# Patient Record
Sex: Female | Born: 1995 | Race: White | Hispanic: No | Marital: Single | Smoking: Former smoker
Health system: Southern US, Community
[De-identification: ages and names within clinical notes are randomized; demographics above are authoritative.]

## PROBLEM LIST (undated history)

## (undated) ENCOUNTER — Inpatient Hospital Stay (HOSPITAL_COMMUNITY)

## (undated) ENCOUNTER — Inpatient Hospital Stay (HOSPITAL_COMMUNITY): Payer: Self-pay

## (undated) DIAGNOSIS — F329 Major depressive disorder, single episode, unspecified: Secondary | ICD-10-CM

## (undated) DIAGNOSIS — R519 Headache, unspecified: Secondary | ICD-10-CM

## (undated) DIAGNOSIS — R51 Headache: Secondary | ICD-10-CM

## (undated) DIAGNOSIS — A749 Chlamydial infection, unspecified: Secondary | ICD-10-CM

## (undated) DIAGNOSIS — F319 Bipolar disorder, unspecified: Secondary | ICD-10-CM

## (undated) DIAGNOSIS — F32A Depression, unspecified: Secondary | ICD-10-CM

## (undated) DIAGNOSIS — F419 Anxiety disorder, unspecified: Secondary | ICD-10-CM

## (undated) HISTORY — PX: NO PAST SURGERIES: SHX2092

---
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
## 2016-04-03 ENCOUNTER — Encounter (HOSPITAL_BASED_OUTPATIENT_CLINIC_OR_DEPARTMENT_OTHER): Payer: Self-pay | Admitting: *Deleted

## 2016-04-03 ENCOUNTER — Emergency Department (HOSPITAL_BASED_OUTPATIENT_CLINIC_OR_DEPARTMENT_OTHER)
Admission: EM | Admit: 2016-04-03 | Discharge: 2016-04-03 | Disposition: A | Discharge status: Home / Self Care | Attending: Emergency Medicine | Admitting: Emergency Medicine

## 2016-04-03 ENCOUNTER — Emergency Department (HOSPITAL_BASED_OUTPATIENT_CLINIC_OR_DEPARTMENT_OTHER)

## 2016-04-03 DIAGNOSIS — R102 Pelvic and perineal pain: Secondary | ICD-10-CM | POA: Insufficient documentation

## 2016-04-03 DIAGNOSIS — N390 Urinary tract infection, site not specified: Secondary | ICD-10-CM

## 2016-04-03 DIAGNOSIS — O23591 Infection of other part of genital tract in pregnancy, first trimester: Secondary | ICD-10-CM | POA: Diagnosis not present

## 2016-04-03 DIAGNOSIS — Z3490 Encounter for supervision of normal pregnancy, unspecified, unspecified trimester: Secondary | ICD-10-CM

## 2016-04-03 DIAGNOSIS — N72 Inflammatory disease of cervix uteri: Secondary | ICD-10-CM

## 2016-04-03 DIAGNOSIS — O23511 Infections of cervix in pregnancy, first trimester: Secondary | ICD-10-CM | POA: Insufficient documentation

## 2016-04-03 DIAGNOSIS — F17210 Nicotine dependence, cigarettes, uncomplicated: Secondary | ICD-10-CM | POA: Diagnosis not present

## 2016-04-03 DIAGNOSIS — E876 Hypokalemia: Secondary | ICD-10-CM | POA: Insufficient documentation

## 2016-04-03 DIAGNOSIS — O2341 Unspecified infection of urinary tract in pregnancy, first trimester: Secondary | ICD-10-CM | POA: Insufficient documentation

## 2016-04-03 DIAGNOSIS — B9689 Other specified bacterial agents as the cause of diseases classified elsewhere: Secondary | ICD-10-CM

## 2016-04-03 DIAGNOSIS — O99331 Smoking (tobacco) complicating pregnancy, first trimester: Secondary | ICD-10-CM | POA: Insufficient documentation

## 2016-04-03 DIAGNOSIS — N760 Acute vaginitis: Secondary | ICD-10-CM

## 2016-04-03 DIAGNOSIS — Z202 Contact with and (suspected) exposure to infections with a predominantly sexual mode of transmission: Secondary | ICD-10-CM | POA: Diagnosis not present

## 2016-04-03 DIAGNOSIS — O219 Vomiting of pregnancy, unspecified: Secondary | ICD-10-CM | POA: Insufficient documentation

## 2016-04-03 DIAGNOSIS — Z3A01 Less than 8 weeks gestation of pregnancy: Secondary | ICD-10-CM | POA: Insufficient documentation

## 2016-04-03 HISTORY — DX: Chlamydial infection, unspecified: A74.9

## 2016-04-03 LAB — COMPREHENSIVE METABOLIC PANEL
ALT: 11 U/L — ABNORMAL LOW (ref 14–54)
ANION GAP: 9 (ref 5–15)
AST: 17 U/L (ref 15–41)
Albumin: 4.3 g/dL (ref 3.5–5.0)
Alkaline Phosphatase: 41 U/L (ref 38–126)
BUN: 7 mg/dL (ref 6–20)
CHLORIDE: 105 mmol/L (ref 101–111)
CO2: 21 mmol/L — ABNORMAL LOW (ref 22–32)
Calcium: 9.2 mg/dL (ref 8.9–10.3)
Creatinine, Ser: 0.59 mg/dL (ref 0.44–1.00)
GFR calc Af Amer: >60 mL/min (ref >60)
Glucose, Bld: 91 mg/dL (ref 65–99)
POTASSIUM: 3.2 mmol/L — AB (ref 3.5–5.1)
SODIUM: 135 mmol/L (ref 135–145)
Total Bilirubin: 0.9 mg/dL (ref 0.3–1.2)
Total Protein: 7.1 g/dL (ref 6.5–8.1)

## 2016-04-03 LAB — CBC WITH DIFFERENTIAL/PLATELET
BASOS PCT: 0 %
Basophils Absolute: 0 10*3/uL (ref 0.0–0.1)
EOS ABS: 0 10*3/uL (ref 0.0–0.7)
EOS PCT: 0 %
HCT: 38.6 % (ref 36.0–46.0)
Hemoglobin: 14 g/dL (ref 12.0–15.0)
LYMPHS ABS: 2.1 10*3/uL (ref 0.7–4.0)
Lymphocytes Relative: 18 %
MCH: 32.5 pg (ref 26.0–34.0)
MCHC: 36.3 g/dL — ABNORMAL HIGH (ref 30.0–36.0)
MCV: 89.6 fL (ref 78.0–100.0)
Monocytes Absolute: 0.9 10*3/uL (ref 0.1–1.0)
Monocytes Relative: 8 %
Neutro Abs: 8.6 10*3/uL — ABNORMAL HIGH (ref 1.7–7.7)
Neutrophils Relative %: 74 %
PLATELETS: 252 10*3/uL (ref 150–400)
RBC: 4.31 MIL/uL (ref 3.87–5.11)
RDW: 11.8 % (ref 11.5–15.5)
WBC: 11.6 10*3/uL — AB (ref 4.0–10.5)

## 2016-04-03 LAB — URINE MICROSCOPIC-ADD ON: RBC / HPF: NONE SEEN RBC/hpf (ref 0–5)

## 2016-04-03 LAB — URINALYSIS, ROUTINE W REFLEX MICROSCOPIC
Bilirubin Urine: NEGATIVE
GLUCOSE, UA: NEGATIVE mg/dL
Hgb urine dipstick: NEGATIVE
NITRITE: NEGATIVE
PROTEIN: 30 mg/dL — AB
Specific Gravity, Urine: 1.023 (ref 1.005–1.030)
pH: 6 (ref 5.0–8.0)

## 2016-04-03 LAB — MAGNESIUM: Magnesium: 2 mg/dL (ref 1.7–2.4)

## 2016-04-03 LAB — WET PREP, GENITAL
Sperm: NONE SEEN
TRICH WET PREP: NONE SEEN
YEAST WET PREP: NONE SEEN

## 2016-04-03 LAB — LIPASE, BLOOD: LIPASE: 25 U/L (ref 11–51)

## 2016-04-03 LAB — PREGNANCY, URINE: PREG TEST UR: POSITIVE — AB

## 2016-04-03 LAB — HCG, QUANTITATIVE, PREGNANCY: hCG, Beta Chain, Quant, S: 28774 m[IU]/mL — ABNORMAL HIGH (ref <5)

## 2016-04-03 MED ORDER — AZITHROMYCIN 500 MG IV SOLR
INTRAVENOUS | Status: AC
  Filled 2016-04-03: qty 500

## 2016-04-03 MED ORDER — DEXTROSE 5 % IV SOLN
500.0000 mg | Freq: Once | INTRAVENOUS | Status: AC
  Administered 2016-04-03: 500 mg via INTRAVENOUS

## 2016-04-03 MED ORDER — SODIUM CHLORIDE 0.9 % IV BOLUS (SEPSIS)
1000.0000 mL | Freq: Once | INTRAVENOUS | Status: AC
  Administered 2016-04-03: 1000 mL via INTRAVENOUS

## 2016-04-03 MED ORDER — PROMETHAZINE HCL 25 MG/ML IJ SOLN
25.0000 mg | Freq: Once | INTRAMUSCULAR | Status: AC
  Administered 2016-04-03: 25 mg via INTRAVENOUS
  Filled 2016-04-03: qty 1

## 2016-04-03 MED ORDER — CEPHALEXIN 500 MG PO CAPS: 500.0000 mg | ORAL_CAPSULE | Freq: Two times a day (BID) | ORAL | 0 refills | Status: DC

## 2016-04-03 MED ORDER — PRENATAL COMPLETE 14-0.4 MG PO TABS: 1.0000 | ORAL_TABLET | Freq: Every day | ORAL | 1 refills | Status: DC

## 2016-04-03 MED ORDER — METRONIDAZOLE 500 MG PO TABS
500.0000 mg | ORAL_TABLET | Freq: Once | ORAL | Status: DC
  Filled 2016-04-03: qty 1

## 2016-04-03 MED ORDER — DOXYLAMINE-PYRIDOXINE 10-10 MG PO TBEC: DELAYED_RELEASE_TABLET | ORAL | 0 refills | Status: DC

## 2016-04-03 MED ORDER — CEFTRIAXONE SODIUM 250 MG IJ SOLR
250.0000 mg | Freq: Once | INTRAMUSCULAR | Status: AC
  Administered 2016-04-03: 250 mg via INTRAMUSCULAR
  Filled 2016-04-03: qty 250

## 2016-04-03 MED ORDER — LIDOCAINE HCL (PF) 1 % IJ SOLN
INTRAMUSCULAR | Status: AC
  Administered 2016-04-03: 0.9 mL
  Filled 2016-04-03: qty 5

## 2016-04-03 MED ORDER — CEPHALEXIN 250 MG PO CAPS
500.0000 mg | ORAL_CAPSULE | Freq: Once | ORAL | Status: DC
  Filled 2016-04-03: qty 2

## 2016-04-03 MED ORDER — AZITHROMYCIN 500 MG IV SOLR
500.0000 mg | Freq: Once | INTRAVENOUS | Status: AC
  Administered 2016-04-03: 500 mg via INTRAVENOUS

## 2016-04-03 MED ORDER — POTASSIUM CHLORIDE CRYS ER 20 MEQ PO TBCR: 20.0000 meq | EXTENDED_RELEASE_TABLET | Freq: Every day | ORAL | 0 refills | Status: DC

## 2016-04-03 MED ORDER — ONDANSETRON HCL 4 MG/2ML IJ SOLN
4.0000 mg | Freq: Once | INTRAMUSCULAR | Status: AC
  Administered 2016-04-03: 4 mg via INTRAVENOUS
  Filled 2016-04-03 (×2): qty 2

## 2016-04-03 MED ORDER — AZITHROMYCIN 250 MG PO TABS
1000.0000 mg | ORAL_TABLET | Freq: Once | ORAL | Status: AC
  Administered 2016-04-03: 1000 mg via ORAL
  Filled 2016-04-03: qty 4

## 2016-04-03 MED ORDER — POTASSIUM CHLORIDE CRYS ER 20 MEQ PO TBCR: 40.0000 meq | EXTENDED_RELEASE_TABLET | Freq: Once | ORAL | Status: DC

## 2016-04-03 MED ORDER — METRONIDAZOLE 500 MG PO TABS: 500.0000 mg | ORAL_TABLET | Freq: Two times a day (BID) | ORAL | 0 refills | Status: DC

## 2016-04-03 NOTE — ED Triage Notes (Signed)
Pt reports mid- LUQ abd pain x4-7days (reports it was intermittent). Reports n/v. Denies fever, genitourinary symptoms.

## 2016-04-03 NOTE — Discharge Instructions (Signed)
Follow with OB/GYN as soon as possible.   Do NOT take any NSAIDs, such as Aspirin, Motrin, Ibuprofen, Aleve, Naproxen etc. Only take Tylenol for pain. Return to the emergency room  for any severe abdominal pain, vaginal bleeding, passing out or repeated vomiting.   Obtain over-the-counter prenatal vitamins. Read the label and make sure that they have at least 400 mcg of folate acid.

## 2016-04-03 NOTE — ED Provider Notes (Signed)
[facility] Provider Note   CSN: [id number] Arrival date & time: 04/03/16  1054  First Provider Contact:  First MD Initiated Contact with Patient 04/03/16 1109        History   Chief Complaint Chief Complaint  Patient presents with  . Abdominal Pain    HPI  Blood pressure 116/70, pulse 60, temperature 98.6 F (37 C), temperature source Oral, resp. rate 18, height 5\' 4"  (1.626 m), weight 61.2 kg, last menstrual period 02/28/2016, SpO2 100 %.  Joanna Gray is a [age] y.o. female complaining of Multiple episodes of nonbloody, nonbilious, non-coffee ground emesis starting 7 days ago, states she can't keep any food or fluids down states that she has vomited 5-10 times over the course of the last day. She denies fever, chills, diarrhea, sick contacts, dysuria, hematuria, flank pain, abnormal vaginal discharge. Patient also states that she would like to be treated for chlamydia, her boyfriend was recently diagnosed, she has not had any evaluation for this. She does not have OB/GYN care. States her pain is in the left upper quadrant and is 6 out of 10, states it's worse before emesis. Last period was one month ago.  HPI  Past Medical History:  Diagnosis Date  . Anxiety   . Bipolar disorder (HCC)   . Chlamydia   . Depression     Patient Active Problem List   Diagnosis Date Noted  . Substance induced mood disorder (HCC) 12/06/2015  . Polysubstance abuse 12/06/2015    History reviewed. No pertinent surgical history.  OB History    No data available       Home Medications    Prior to Admission medications   Medication Sig Start Date End Date Taking? Authorizing Provider  cephALEXin (KEFLEX) 500 MG capsule Take 1 capsule (500 mg total) by mouth 2 (two) times daily. 04/03/16   Devaris Quirk, PA-C  Doxylamine-Pyridoxine 10-10 MG TBEC Start: 2 tabs PO qhs; if sx persist after 2 days, incr. to 1 tab PO qam and 2 tabs PO qhs; may further incr. to 1 tab PO qam, 1 tab  PO every mid-afternoon, and 2 tabs PO qhs; Max: 4 tabs/day; Info: give on empty stomach; do not cut/crush/chew 04/03/16   Joni ReiningNicole Glada Wickstrom, PA-C  metroNIDAZOLE (FLAGYL) 500 MG tablet Take 1 tablet (500 mg total) by mouth 2 (two) times daily. One tab PO bid x 10 days 04/03/16   Joni ReiningNicole Maija Biggers, PA-C  potassium chloride SA (K-DUR,KLOR-CON) 20 MEQ tablet Take 1 tablet (20 mEq total) by mouth daily. 04/03/16   Jaquelin Meaney, PA-C  Prenatal Vit-Fe Fumarate-FA (PRENATAL COMPLETE) 14-0.4 MG TABS Take 1 tablet by mouth daily. 04/03/16   Joni ReiningNicole Maraki Macquarrie, PA-C    Family History No family history on file.  Social History Social History  Substance Use Topics  . Smoking status: Current Every Day Smoker    Types: Cigarettes  . Smokeless tobacco: Never Used  . Alcohol use No     Allergies   Review of patient's allergies indicates no known allergies.   Review of Systems Review of Systems  10 systems reviewed and found to be negative, except as noted in the HPI.   Physical Exam Updated Vital Signs BP 116/74 (BP Location: Left Arm)   Pulse 61   Temp 98.6 F (37 C) (Oral)   Resp 16   Ht 5\' 4"  (1.626 m)   Wt 61.2 kg   LMP 02/28/2016 (Approximate)   SpO2 100%   BMI 23.17 kg/m   Physical  Exam  Constitutional: She is oriented to person, place, and time. She appears well-developed and well-nourished. No distress.  HENT:  Head: Normocephalic and atraumatic.  Mucous membranes mildly dry  Eyes: Conjunctivae and EOM are normal. Pupils are equal, round, and reactive to light.  Neck: Normal range of motion.  Cardiovascular: Normal rate, regular rhythm and intact distal pulses.   Pulmonary/Chest: Effort normal and breath sounds normal.  Abdominal: Soft. She exhibits no distension and no mass. There is no tenderness. There is no rebound and no guarding. No hernia.  Genitourinary:  Genitourinary Comments: Pelvic exam a chaperoned by technician: No rashes or lesions, scant, whitish yellow,  homogenous opaque non-foul-smelling vaginal discharge. No cervical motion or adnexal tenderness.  Musculoskeletal: Normal range of motion.  Neurological: She is alert and oriented to person, place, and time.  Skin: She is not diaphoretic.  Psychiatric: She has a normal mood and affect.  Nursing note and vitals reviewed.    ED Treatments / Results  Labs (all labs ordered are listed, but only abnormal results are displayed) Labs Reviewed  WET PREP, GENITAL - Abnormal; Notable for the following:       Result Value   Clue Cells Wet Prep HPF POC PRESENT (*)    WBC, Wet Prep HPF POC MANY (*)    All other components within normal limits  CBC WITH DIFFERENTIAL/PLATELET - Abnormal; Notable for the following:    WBC 11.6 (*)    MCHC 36.3 (*)    Neutro Abs 8.6 (*)    All other components within normal limits  COMPREHENSIVE METABOLIC PANEL - Abnormal; Notable for the following:    Potassium 3.2 (*)    CO2 21 (*)    ALT 11 (*)    All other components within normal limits  URINALYSIS, ROUTINE W REFLEX MICROSCOPIC (NOT AT [facility]) - Abnormal; Notable for the following:    Ketones, ur >80 (*)    Protein, ur 30 (*)    Leukocytes, UA MODERATE (*)    All other components within normal limits  PREGNANCY, URINE - Abnormal; Notable for the following:    Preg Test, Ur POSITIVE (*)    All other components within normal limits  HCG, QUANTITATIVE, PREGNANCY - Abnormal; Notable for the following:    hCG, Beta Chain, Quant, S 28,774 (*)    All other components within normal limits  URINE MICROSCOPIC-ADD ON - Abnormal; Notable for the following:    Squamous Epithelial / LPF 0-5 (*)    Bacteria, UA MANY (*)    All other components within normal limits  URINE CULTURE  LIPASE, BLOOD  MAGNESIUM  RPR  HIV ANTIBODY (ROUTINE TESTING)  GC/CHLAMYDIA PROBE AMP ([place]) NOT AT [facility]    EKG  EKG Interpretation None       Radiology US Ob Comp Less 14 Wks  Result Date: 04/03/2016 CLINICAL DATA:   LEFT pelvic pain for 1 day, nausea, vomiting, pregnant EXAM: OBSTETRIC <14 WK [place] AND TRANSVAGINAL OB US TECHNIQUE: Both transabdominal and transvaginal ultrasound examinations were performed for complete evaluation of the gestation as well as the maternal uterus, adnexal regions, and pelvic cul-de-sac. Transvaginal technique was performed to assess early pregnancy. COMPARISON:  None FINDINGS: Intrauterine gestational sac: Present Yolk sac:  Present Embryo:  Not identified Cardiac Activity: N/A Heart Rate: N/A  bpm MSD: 14.2  mm   6 w   2  d Subchorionic hemorrhage:  None visualized. Maternal uterus/adnexae: LEFT ovary normal size and morphology, 1.7 x 2.3 x  2.0 cm. RIGHT ovary measures 3.2 x 3.4 x 2.5 cm and contains a potential hemorrhagic corpus luteal cyst measuring 2.8 x 1.7 x 1.2 cm. Blood flow present within both ovaries on color Doppler imaging No free pelvic fluid or adnexal masses. IMPRESSION: Intrauterine gestational sac containing a yolk sac but no fetal pole identified. Probable hemorrhagic corpus luteal cyst RIGHT ovary. Electronically Signed   By: Ulyses Southward M.D.   On: 04/03/2016 14:01   US Ob Transvaginal  Result Date: 04/03/2016 CLINICAL DATA:  LEFT pelvic pain for 1 day, nausea, vomiting, pregnant EXAM: OBSTETRIC <14 WK [place] AND TRANSVAGINAL OB US TECHNIQUE: Both transabdominal and transvaginal ultrasound examinations were performed for complete evaluation of the gestation as well as the maternal uterus, adnexal regions, and pelvic cul-de-sac. Transvaginal technique was performed to assess early pregnancy. COMPARISON:  None FINDINGS: Intrauterine gestational sac: Present Yolk sac:  Present Embryo:  Not identified Cardiac Activity: N/A Heart Rate: N/A  bpm MSD: 14.2  mm   6 w   2  d Subchorionic hemorrhage:  None visualized. Maternal uterus/adnexae: LEFT ovary normal size and morphology, 1.7 x 2.3 x 2.0 cm. RIGHT ovary measures 3.2 x 3.4 x 2.5 cm and contains a potential hemorrhagic corpus luteal  cyst measuring 2.8 x 1.7 x 1.2 cm. Blood flow present within both ovaries on color Doppler imaging No free pelvic fluid or adnexal masses. IMPRESSION: Intrauterine gestational sac containing a yolk sac but no fetal pole identified. Probable hemorrhagic corpus luteal cyst RIGHT ovary. Electronically Signed   By: Ulyses Southward M.D.   On: 04/03/2016 14:01    Procedures Procedures (including critical care time)  Medications Ordered in ED Medications  azithromycin (ZITHROMAX) 500 mg in dextrose 5 % 250 mL IVPB (500 mg Intravenous New Bag/Given 04/03/16 1544)  ondansetron (ZOFRAN) injection 4 mg (4 mg Intravenous Refused 04/03/16 1544)  azithromycin (ZITHROMAX) 500 MG injection (  Not Given 04/03/16 1545)  cephALEXin (KEFLEX) capsule 500 mg (not administered)  metroNIDAZOLE (FLAGYL) tablet 500 mg (not administered)  potassium chloride SA (K-DUR,KLOR-CON) CR tablet 40 mEq (not administered)  sodium chloride 0.9 % bolus 1,000 mL (0 mLs Intravenous Stopped 04/03/16 1310)  cefTRIAXone (ROCEPHIN) injection 250 mg (250 mg Intramuscular Given 04/03/16 1229)  azithromycin (ZITHROMAX) tablet 1,000 mg (1,000 mg Oral Given 04/03/16 1313)  lidocaine (PF) (XYLOCAINE) 1 % injection (0.9 mLs  Given 04/03/16 1230)  promethazine (PHENERGAN) injection 25 mg (25 mg Intravenous Given 04/03/16 1241)  azithromycin (ZITHROMAX) 500 mg in dextrose 5 % 250 mL IVPB (0 mg Intravenous Stopped 04/03/16 1544)  sodium chloride 0.9 % bolus 1,000 mL (1,000 mLs Intravenous New Bag/Given 04/03/16 1435)     Initial Impression / Assessment and Plan / ED Course  I have reviewed the triage vital signs and the nursing notes.  Pertinent labs & imaging results that were available during my care of the patient were reviewed by me and considered in my medical decision making (see chart for details).  Clinical Course    Vitals:   04/03/16 1108 04/03/16 1109 04/03/16 1242 04/03/16 1413  BP: 116/70  118/70 116/74  Pulse: 60  60 61  Resp: 18  18  16   Temp: 98.6 F (37 C)     TempSrc: Oral     SpO2: 100%  100% 100%  Weight:  61.2 kg    Height:  5\' 4"  (1.626 m)      Medications  azithromycin (ZITHROMAX) 500 mg in dextrose 5 % 250 mL IVPB (500 mg  Intravenous New Bag/Given 04/03/16 1544)  ondansetron (ZOFRAN) injection 4 mg (4 mg Intravenous Refused 04/03/16 1544)  azithromycin (ZITHROMAX) 500 MG injection (  Not Given 04/03/16 1545)  cephALEXin (KEFLEX) capsule 500 mg (not administered)  metroNIDAZOLE (FLAGYL) tablet 500 mg (not administered)  potassium chloride SA (K-DUR,KLOR-CON) CR tablet 40 mEq (not administered)  sodium chloride 0.9 % bolus 1,000 mL (0 mLs Intravenous Stopped 04/03/16 1310)  cefTRIAXone (ROCEPHIN) injection 250 mg (250 mg Intramuscular Given 04/03/16 1229)  azithromycin (ZITHROMAX) tablet 1,000 mg (1,000 mg Oral Given 04/03/16 1313)  lidocaine (PF) (XYLOCAINE) 1 % injection (0.9 mLs  Given 04/03/16 1230)  promethazine (PHENERGAN) injection 25 mg (25 mg Intravenous Given 04/03/16 1241)  azithromycin (ZITHROMAX) 500 mg in dextrose 5 % 250 mL IVPB (0 mg Intravenous Stopped 04/03/16 1544)  sodium chloride 0.9 % bolus 1,000 mL (1,000 mLs Intravenous New Bag/Given 04/03/16 1435)    Joanna Gray is 20 y.o. female presenting with 1 week of nausea and vomiting and left upper quadrant abdominal pain however, on my exam she has no tenderness on the abdomen at all. Patient is afebrile appears slightly dehydrated. Patient is also reporting that her boyfriend has chlamydia, she's denying any symptoms.  Patient's urine pregnancy is positive, her last menstrual period was on July 21 and she states it was not particularly light. Her vomiting is probably morning sickness, I doubt this is an ectopic based on the benign nature of the abdominal exam, however, given her subjective pain will obtain ultrasound.  Ultrasound with the intrauterine gestational sac and yolk but no fetal pole estimated date of 6 weeks and 2 days. They also see a  likely hemorrhagic corpus luteal cyst in the right ovary, this is not on the area of her pain. I don't think this is consistent with a ectopic.  Patient has continued to vomit despite Phenergan while in ultrasound, it appears that she vomited all of her azithromycin, discussed case with pharmacist at [facility] who states that the IV doses of azithromycin would be equivalent to by mouth dosage however she will need 2x runs of 500.  Urinalysis is consistent with infection, urine culture pending, she's received Rocephin for STDs, will also start her on Keflex patient is significantly dehydrated with greater than 80 ketones in her urine.  Pelvic exam with some discharge but no cervical motion or adnexal tenderness. Clue cells and white blood cells are there, I think this is just from a cervicitis. No cervical motion or adnexal tenderness consistent with PID. Will start her on Flagyl as well for bacterial vaginosis.   After fluids patient states that she feels much better, she was able to drink a smoothie and not vomit. Chlamydia. We've had an extensive discussion of return precautions and including return precautions for ectopic. [facility]  Would like to get this patient Zofran and then give her her Flagyl, Keflex and potassium however she is afraid to take the medication because she feels she will vomit. States that she just wants to go home, I've counseled her that she will need to take the medications when she gets home and if she vomits the medication she will need to return to the ED, her and her boyfriend verbalized understanding.  Evaluation does not show pathology that would require ongoing emergent intervention or inpatient treatment. Pt is hemodynamically stable and mentating appropriately. Discussed findings and plan with patient/guardian, who agrees with care plan. All questions answered. Return precautions discussed and outpatient  follow up given.       Final Clinical Impressions(s) / ED Diagnoses   Final diagnoses:  Pregnancy  Nausea/vomiting in pregnancy  Cervicitis  UTI (lower urinary tract infection)  BV (bacterial vaginosis)  Hypokalemia  Exposure to STD    New Prescriptions New Prescriptions   CEPHALEXIN (KEFLEX) 500 MG CAPSULE    Take 1 capsule (500 mg total) by mouth 2 (two) times daily.   DOXYLAMINE-PYRIDOXINE 10-10 MG TBEC    Start: 2 tabs PO qhs; if sx persist after 2 days, incr. to 1 tab PO qam and 2 tabs PO qhs; may further incr. to 1 tab PO qam, 1 tab PO every mid-afternoon, and 2 tabs PO qhs; Max: 4 tabs/day; Info: give on empty stomach; do not cut/crush/chew   METRONIDAZOLE (FLAGYL) 500 MG TABLET    Take 1 tablet (500 mg total) by mouth 2 (two) times daily. One tab PO bid x 10 days   POTASSIUM CHLORIDE SA (K-DUR,KLOR-CON) 20 MEQ TABLET    Take 1 tablet (20 mEq total) by mouth daily.   PRENATAL VIT-FE FUMARATE-FA (PRENATAL COMPLETE) 14-0.4 MG TABS    Take 1 tablet by mouth daily.     Wynetta Emery, PA-C 04/03/16 1643    Pricilla Loveless, MD 04/04/16 [phone]

## 2016-04-04 LAB — HIV ANTIBODY (ROUTINE TESTING W REFLEX): HIV SCREEN 4TH GENERATION: NONREACTIVE

## 2016-04-04 LAB — RPR: RPR: NONREACTIVE

## 2016-04-04 NOTE — ED Notes (Signed)
Pt able to tolerate lemon ice pop at time of discharge with no n/v.

## 2016-04-05 LAB — URINE CULTURE: CULTURE: NO GROWTH

## 2016-04-05 LAB — GC/CHLAMYDIA PROBE AMP (~~LOC~~) NOT AT ARMC
CHLAMYDIA, DNA PROBE: POSITIVE — AB
Neisseria Gonorrhea: NEGATIVE

## 2016-04-15 ENCOUNTER — Emergency Department (HOSPITAL_BASED_OUTPATIENT_CLINIC_OR_DEPARTMENT_OTHER)

## 2016-04-15 ENCOUNTER — Encounter (HOSPITAL_BASED_OUTPATIENT_CLINIC_OR_DEPARTMENT_OTHER): Payer: Self-pay | Admitting: *Deleted

## 2016-04-15 ENCOUNTER — Emergency Department (HOSPITAL_BASED_OUTPATIENT_CLINIC_OR_DEPARTMENT_OTHER)
Admission: EM | Admit: 2016-04-15 | Discharge: 2016-04-15 | Disposition: A | Discharge status: Home / Self Care | Attending: Emergency Medicine | Admitting: Emergency Medicine

## 2016-04-15 DIAGNOSIS — O219 Vomiting of pregnancy, unspecified: Secondary | ICD-10-CM | POA: Diagnosis not present

## 2016-04-15 DIAGNOSIS — F17210 Nicotine dependence, cigarettes, uncomplicated: Secondary | ICD-10-CM | POA: Insufficient documentation

## 2016-04-15 DIAGNOSIS — R102 Pelvic and perineal pain: Secondary | ICD-10-CM | POA: Diagnosis not present

## 2016-04-15 DIAGNOSIS — Z3A01 Less than 8 weeks gestation of pregnancy: Secondary | ICD-10-CM | POA: Insufficient documentation

## 2016-04-15 LAB — URINALYSIS, ROUTINE W REFLEX MICROSCOPIC
Bilirubin Urine: NEGATIVE
GLUCOSE, UA: NEGATIVE mg/dL
Hgb urine dipstick: NEGATIVE
Ketones, ur: >80 mg/dL — AB
Nitrite: NEGATIVE
PH: 7.5 (ref 5.0–8.0)
Protein, ur: 30 mg/dL — AB
SPECIFIC GRAVITY, URINE: 1.027 (ref 1.005–1.030)

## 2016-04-15 LAB — CBC WITH DIFFERENTIAL/PLATELET
Basophils Absolute: 0 10*3/uL (ref 0.0–0.1)
Basophils Relative: 0 %
EOS ABS: 0 10*3/uL (ref 0.0–0.7)
Eosinophils Relative: 0 %
HCT: 38.4 % (ref 36.0–46.0)
HEMOGLOBIN: 14 g/dL (ref 12.0–15.0)
LYMPHS ABS: 1.3 10*3/uL (ref 0.7–4.0)
LYMPHS PCT: 9 %
MCH: 32.6 pg (ref 26.0–34.0)
MCHC: 36.5 g/dL — ABNORMAL HIGH (ref 30.0–36.0)
MCV: 89.3 fL (ref 78.0–100.0)
Monocytes Absolute: 0.9 10*3/uL (ref 0.1–1.0)
Monocytes Relative: 6 %
NEUTROS PCT: 85 %
Neutro Abs: 12.2 10*3/uL — ABNORMAL HIGH (ref 1.7–7.7)
Platelets: 285 10*3/uL (ref 150–400)
RBC: 4.3 MIL/uL (ref 3.87–5.11)
RDW: 11.8 % (ref 11.5–15.5)
WBC: 14.4 10*3/uL — AB (ref 4.0–10.5)

## 2016-04-15 LAB — URINE MICROSCOPIC-ADD ON

## 2016-04-15 LAB — COMPREHENSIVE METABOLIC PANEL
ALK PHOS: 41 U/L (ref 38–126)
ALT: 17 U/L (ref 14–54)
AST: 27 U/L (ref 15–41)
Albumin: 4 g/dL (ref 3.5–5.0)
Anion gap: 10 (ref 5–15)
BUN: 6 mg/dL (ref 6–20)
CALCIUM: 9.3 mg/dL (ref 8.9–10.3)
CO2: 18 mmol/L — AB (ref 22–32)
CREATININE: 0.69 mg/dL (ref 0.44–1.00)
Chloride: 107 mmol/L (ref 101–111)
GFR calc non Af Amer: >60 mL/min (ref >60)
GLUCOSE: 122 mg/dL — AB (ref 65–99)
Potassium: 3.4 mmol/L — ABNORMAL LOW (ref 3.5–5.1)
SODIUM: 135 mmol/L (ref 135–145)
Total Bilirubin: 0.4 mg/dL (ref 0.3–1.2)
Total Protein: 6.8 g/dL (ref 6.5–8.1)

## 2016-04-15 LAB — LIPASE, BLOOD: Lipase: 18 U/L (ref 11–51)

## 2016-04-15 LAB — HCG, QUANTITATIVE, PREGNANCY: HCG, BETA CHAIN, QUANT, S: 185613 m[IU]/mL — AB (ref <5)

## 2016-04-15 LAB — MAGNESIUM: Magnesium: 1.6 mg/dL — ABNORMAL LOW (ref 1.7–2.4)

## 2016-04-15 MED ORDER — MAGNESIUM SULFATE 2 GM/50ML IV SOLN
2.0000 g | Freq: Once | INTRAVENOUS | Status: AC
  Administered 2016-04-15: 2 g via INTRAVENOUS
  Filled 2016-04-15: qty 50

## 2016-04-15 MED ORDER — SODIUM CHLORIDE 0.9 % IV BOLUS (SEPSIS)
2000.0000 mL | Freq: Once | INTRAVENOUS | Status: AC
  Administered 2016-04-15: 2000 mL via INTRAVENOUS

## 2016-04-15 MED ORDER — PROMETHAZINE HCL 25 MG/ML IJ SOLN
25.0000 mg | Freq: Once | INTRAMUSCULAR | Status: AC
  Administered 2016-04-15: 25 mg via INTRAVENOUS
  Filled 2016-04-15: qty 1

## 2016-04-15 NOTE — ED Triage Notes (Signed)
C/o n/v x 3 weeks d/t pregnancy per pt. Pt is  [redacted] weeks pregnant. Has muscle aches in abd d/t vomiting. Pt states vomiting is intermittant and some times gets better in the evening.

## 2016-04-15 NOTE — ED Provider Notes (Signed)
[facility] Provider Note   CSN: [id number] Arrival date & time: 04/15/16  1191     History   Chief Complaint Chief Complaint  Patient presents with  . Emesis    HPI  Blood pressure (!) 132/106, resp. rate (!) 44, height 5\' 4"  (1.626 m), weight 63.5 kg, last menstrual period 02/28/2016, SpO2 100 %.  Joanna Gray is a 20 y.o. female complaining of persistent nausea vomiting abdominal cramping going on for several weeks since recently diagnosed pregnancy. Patient also reports shortness of breath and anxiety: states that she is "pissed off" because she can't stop vomiting. She has not been able to see an OB/GYN, she has an appointment in one week. Patient was given prescription for diclegist, this was not covered by her insurance but she was instructed by pharmacist to take ingredients separately, it has not helped her. She denies cough, fever, Increasing peripheral edema calf pain, leg swelling, hemoptysis, dysuria, Vaginal discharge. On review of systems patient notes an upper abdominal pain which she thinks is secondary to the vomiting.  HPI  Past Medical History:  Diagnosis Date  . Anxiety   . Bipolar disorder (HCC)   . Chlamydia   . Depression     Patient Active Problem List   Diagnosis Date Noted  . Substance induced mood disorder (HCC) 12/06/2015  . Polysubstance abuse 12/06/2015    History reviewed. No pertinent surgical history.  OB History    Gravida Para Term Preterm AB Living   1             SAB TAB Ectopic Multiple Live Births                   Home Medications    Prior to Admission medications   Medication Sig Start Date End Date Taking? Authorizing Provider  cephALEXin (KEFLEX) 500 MG capsule Take 1 capsule (500 mg total) by mouth 2 (two) times daily. 04/03/16  Yes Scott Vanderveer, PA-C  Doxylamine-Pyridoxine 10-10 MG TBEC Start: 2 tabs PO qhs; if sx persist after 2 days, incr. to 1 tab PO qam and 2 tabs PO qhs; may further incr. to 1  tab PO qam, 1 tab PO every mid-afternoon, and 2 tabs PO qhs; Max: 4 tabs/day; Info: give on empty stomach; do not cut/crush/chew 04/03/16  Yes Moses Odoherty, PA-C  metroNIDAZOLE (FLAGYL) 500 MG tablet Take 1 tablet (500 mg total) by mouth 2 (two) times daily. One tab PO bid x 10 days 04/03/16  Yes Heike Pounds, PA-C  potassium chloride SA (K-DUR,KLOR-CON) 20 MEQ tablet Take 1 tablet (20 mEq total) by mouth daily. 04/03/16  Yes Icker Swigert, PA-C  Prenatal Vit-Fe Fumarate-FA (PRENATAL COMPLETE) 14-0.4 MG TABS Take 1 tablet by mouth daily. 04/03/16  Yes Corrine Tillis, PA-C    Family History No family history on file.  Social History Social History  Substance Use Topics  . Smoking status: Current Every Day Smoker    Types: Cigarettes  . Smokeless tobacco: Never Used  . Alcohol use No     Allergies   Review of patient's allergies indicates no known allergies.   Review of Systems Review of Systems   Physical Exam Updated Vital Signs BP 112/63 (BP Location: Left Arm)   Pulse 75   Temp 98.9 F (37.2 C)   Resp 22   Ht 5\' 4"  (1.626 m)   Wt 63.5 kg   LMP 03/02/2016   SpO2 100%   BMI 24.03 kg/m   Physical Exam  Constitutional:  Retching and vomiting, patient is hyperventilating     ED Treatments / Results  Labs (all labs ordered are listed, but only abnormal results are displayed) Labs Reviewed  CBC WITH DIFFERENTIAL/PLATELET - Abnormal; Notable for the following:       Result Value   WBC 14.4 (*)    MCHC 36.5 (*)    Neutro Abs 12.2 (*)    All other components within normal limits  COMPREHENSIVE METABOLIC PANEL - Abnormal; Notable for the following:    Potassium 3.4 (*)    CO2 18 (*)    Glucose, Bld 122 (*)    All other components within normal limits  HCG, QUANTITATIVE, PREGNANCY - Abnormal; Notable for the following:    hCG, Beta Chain, Quant, S 185,613 (*)    All other components within normal limits  MAGNESIUM - Abnormal; Notable for the following:     Magnesium 1.6 (*)    All other components within normal limits  URINALYSIS, ROUTINE W REFLEX MICROSCOPIC (NOT AT [facility]) - Abnormal; Notable for the following:    Color, Urine AMBER (*)    APPearance CLOUDY (*)    Ketones, ur >80 (*)    Protein, ur 30 (*)    Leukocytes, UA SMALL (*)    All other components within normal limits  URINE MICROSCOPIC-ADD ON - Abnormal; Notable for the following:    Squamous Epithelial / LPF 6-30 (*)    Bacteria, UA FEW (*)    All other components within normal limits  LIPASE, BLOOD    EKG  EKG Interpretation None       Radiology US Ob Comp Less 14 Wks  Result Date: 04/15/2016 CLINICAL DATA:  Nausea and vomiting. Recent gestational sac confirmation without fetus seen. EXAM: OBSTETRIC <14 WK [place] AND TRANSVAGINAL OB US TECHNIQUE: Both transabdominal and transvaginal ultrasound examinations were performed for complete evaluation of the gestation as well as the maternal uterus, adnexal regions, and pelvic cul-de-sac. Transvaginal technique was performed to assess early pregnancy. COMPARISON:  April 03, 2016 FINDINGS: Intrauterine gestational sac: Visualized Yolk sac:  Visualized Embryo:  Visualized Cardiac Activity: Visualized Heart Rate: 145  bpm CRL:  12  mm   7 w   3 d                  [place] ED[facility]: November 29, 2016 Subchorionic hemorrhage:  None visualized. Maternal uterus/adnexae: Cervical os is closed. Within the right ovary, there is a focal isoechoic mass measuring 2.0 x 1.9 x 2.1 cm which likely represents a hemorrhagic corpus luteum. This finding was present on prior study. It overall appears marginally smaller. No new pelvic mass. No free pelvic fluid. IMPRESSION: Single live intrauterine gestation with estimated gestational age of 7+ weeks. Cervical os closed. No subchorionic hemorrhage. Probable hemorrhagic corpus luteum on the right. Electronically Signed   By: Bretta Bang III M.D.   On: 04/15/2016 11:37   US Ob Transvaginal  Result Date:  04/15/2016 CLINICAL DATA:  Nausea and vomiting. Recent gestational sac confirmation without fetus seen. EXAM: OBSTETRIC <14 WK [place] AND TRANSVAGINAL OB US TECHNIQUE: Both transabdominal and transvaginal ultrasound examinations were performed for complete evaluation of the gestation as well as the maternal uterus, adnexal regions, and pelvic cul-de-sac. Transvaginal technique was performed to assess early pregnancy. COMPARISON:  April 03, 2016 FINDINGS: Intrauterine gestational sac: Visualized Yolk sac:  Visualized Embryo:  Visualized Cardiac Activity: Visualized Heart Rate: 145  bpm CRL:  12  mm   7 w  3 d                  [place] [facility]: November 29, 2016 Subchorionic hemorrhage:  None visualized. Maternal uterus/adnexae: Cervical os is closed. Within the right ovary, there is a focal isoechoic mass measuring 2.0 x 1.9 x 2.1 cm which likely represents a hemorrhagic corpus luteum. This finding was present on prior study. It overall appears marginally smaller. No new pelvic mass. No free pelvic fluid. IMPRESSION: Single live intrauterine gestation with estimated gestational age of 7+ weeks. Cervical os closed. No subchorionic hemorrhage. Probable hemorrhagic corpus luteum on the right. Electronically Signed   By: Bretta Bang III M.D.   On: 04/15/2016 11:37    Procedures Procedures (including critical care time)  Medications Ordered in ED Medications  sodium chloride 0.9 % bolus 2,000 mL (0 mLs Intravenous Stopped 04/15/16 1234)  promethazine (PHENERGAN) injection 25 mg (25 mg Intravenous Given 04/15/16 1010)  magnesium sulfate IVPB 2 g 50 mL (0 g Intravenous Stopped 04/15/16 1234)     Initial Impression / Assessment and Plan / ED Course  I have reviewed the triage vital signs and the nursing notes.  Pertinent labs & imaging results that were available during my care of the patient were reviewed by me and considered in my medical decision making (see chart for details).  Clinical Course  Value Comment By  Time  Magnesium: (!) 1.6 (Reviewed) Wynetta Emery, PA-C 08/24 1051   Vitals:   04/15/16 0932 04/15/16 1125 04/15/16 1155  BP: (!) 132/106 112/63   Pulse:  75   Resp: (!) 44 22   Temp:   98.9 F (37.2 C)  SpO2: 100% 100%   Weight: 63.5 kg    Height: 5\' 4"  (1.626 m)      Medications  sodium chloride 0.9 % bolus 2,000 mL (0 mLs Intravenous Stopped 04/15/16 1234)  promethazine (PHENERGAN) injection 25 mg (25 mg Intravenous Given 04/15/16 1010)  magnesium sulfate IVPB 2 g 50 mL (0 g Intravenous Stopped 04/15/16 1234)    Joanna Gray is 20 y.o. female presenting with Persistent nausea and vomiting in first trimester of pregnancy. Patient was seen and treated for chlamydia and UTI with persistent vomiting. States that today there is a shortness of breath is associated with hyperventilation and think this is secondary to anxiety/frustration with the persistent vomiting. Her abdominal exam is nonsurgical, patient does not report any dysuria or vaginal discharge. Will check a urinalysis and on last visit station all sac was visualized with no embryo. Will repeat ultrasound.  Ultrasound with normal fetal heart rate, expected progression of pregnancy. Repeat abdominal exam is benign. Mild hypomagnesemia. Patient is not febrile, her hyperventilating has resolved, I think that's the cause of her low bicarbonate on chemistry panel. She is tolerating oral fluids in the ED, she does have greater than 80 ketones in her urine, I think that her dehydration is contributing to the morning sickness. Discussed pros and cons of starting Zofran in pregnancy. Patient would like to wait and follow with OB/GYN.  Discussed case with attending physician who agrees with care plan and disposition.   Evaluation does not show pathology that would require ongoing emergent intervention or inpatient treatment. Pt is hemodynamically stable and mentating appropriately. Discussed findings and plan with patient/guardian, who  agrees with care plan. All questions answered. Return precautions discussed and outpatient follow up given.     Final Clinical Impressions(s) / ED Diagnoses   Final diagnoses:  Nausea/vomiting in pregnancy  New Prescriptions Discharge Medication List as of 04/15/2016 12:30 PM       Wynetta Emeryicole Floella Ensz, PA-C 04/15/16 1252    Lyndal Pulleyaniel Knott, MD 04/15/16 [phone]

## 2016-04-15 NOTE — Discharge Instructions (Signed)
Please follow with your primary care doctor in the next 2 days for a check-up. They must obtain records for further management.  ° °Do not hesitate to return to the Emergency Department for any new, worsening or concerning symptoms.  ° °

## 2016-04-15 NOTE — ED Notes (Signed)
Patient transported to Ultrasound 

## 2016-05-12 ENCOUNTER — Encounter: Payer: Self-pay | Admitting: Obstetrics & Gynecology

## 2016-06-09 ENCOUNTER — Encounter (HOSPITAL_COMMUNITY): Payer: Self-pay

## 2016-06-09 ENCOUNTER — Inpatient Hospital Stay (HOSPITAL_COMMUNITY)
Admission: AD | Admit: 2016-06-09 | Discharge: 2016-06-09 | Disposition: A | Discharge status: Home / Self Care | Source: Ambulatory Visit | Attending: Obstetrics and Gynecology | Admitting: Obstetrics and Gynecology

## 2016-06-09 DIAGNOSIS — O210 Mild hyperemesis gravidarum: Secondary | ICD-10-CM | POA: Diagnosis not present

## 2016-06-09 DIAGNOSIS — F17210 Nicotine dependence, cigarettes, uncomplicated: Secondary | ICD-10-CM | POA: Diagnosis not present

## 2016-06-09 DIAGNOSIS — O99332 Smoking (tobacco) complicating pregnancy, second trimester: Secondary | ICD-10-CM | POA: Diagnosis not present

## 2016-06-09 DIAGNOSIS — O219 Vomiting of pregnancy, unspecified: Secondary | ICD-10-CM | POA: Diagnosis not present

## 2016-06-09 DIAGNOSIS — Z3A15 15 weeks gestation of pregnancy: Secondary | ICD-10-CM | POA: Diagnosis not present

## 2016-06-09 HISTORY — DX: Headache: R51

## 2016-06-09 HISTORY — DX: Headache, unspecified: R51.9

## 2016-06-09 LAB — URINALYSIS, ROUTINE W REFLEX MICROSCOPIC
Bilirubin Urine: NEGATIVE
GLUCOSE, UA: 500 mg/dL — AB
Hgb urine dipstick: NEGATIVE
KETONES UR: 15 mg/dL — AB
LEUKOCYTES UA: NEGATIVE
NITRITE: NEGATIVE
PROTEIN: NEGATIVE mg/dL
Specific Gravity, Urine: 1.02 (ref 1.005–1.030)
pH: 8.5 — ABNORMAL HIGH (ref 5.0–8.0)

## 2016-06-09 MED ORDER — SCOPOLAMINE 1 MG/3DAYS TD PT72
1.0000 | MEDICATED_PATCH | Freq: Once | TRANSDERMAL | Status: DC
  Administered 2016-06-09: 1.5 mg via TRANSDERMAL
  Filled 2016-06-09: qty 1

## 2016-06-09 MED ORDER — LACTATED RINGERS IV BOLUS (SEPSIS)
1000.0000 mL | Freq: Once | INTRAVENOUS | Status: AC
  Administered 2016-06-09: 1000 mL via INTRAVENOUS

## 2016-06-09 MED ORDER — FAMOTIDINE IN NACL 20-0.9 MG/50ML-% IV SOLN
20.0000 mg | Freq: Once | INTRAVENOUS | Status: AC
  Administered 2016-06-09: 20 mg via INTRAVENOUS
  Filled 2016-06-09: qty 50

## 2016-06-09 MED ORDER — SODIUM CHLORIDE 0.9 % IV SOLN
8.0000 mg | Freq: Once | INTRAVENOUS | Status: AC
  Administered 2016-06-09: 8 mg via INTRAVENOUS
  Filled 2016-06-09: qty 4

## 2016-06-09 MED ORDER — PROMETHAZINE HCL 25 MG/ML IJ SOLN
25.0000 mg | Freq: Once | INTRAVENOUS | Status: AC
  Administered 2016-06-09: 25 mg via INTRAVENOUS
  Filled 2016-06-09: qty 1

## 2016-06-09 MED ORDER — ONDANSETRON 8 MG PO TBDP: 8.0000 mg | ORAL_TABLET | Freq: Three times a day (TID) | ORAL | 0 refills | Status: DC | PRN

## 2016-06-09 MED ORDER — PROMETHAZINE HCL 25 MG PO TABS: 12.5000 mg | ORAL_TABLET | Freq: Four times a day (QID) | ORAL | 0 refills | Status: DC | PRN

## 2016-06-09 NOTE — Discharge Instructions (Signed)

## 2016-06-09 NOTE — MAU Note (Signed)
Patient coughing repeatedly. Occasionally will spit into bag. No vomiting observed. Asking for ice chips. Informed that she will be given ice chips and /or PO fluids after she has received some fluids with Phenergan.

## 2016-06-09 NOTE — MAU Note (Signed)
Ongoing vomiting.  Unable to keep water down this morning. Has been taking meds prescribed by MD.  The vomiting is making her stomach and throwat hurt

## 2016-06-09 NOTE — Progress Notes (Signed)
Observed patient putting her finger down her throat several times to try and vomit. Patient continues to cough and spit into bag occasionally.

## 2016-06-09 NOTE — MAU Provider Note (Signed)
History     CSN: [id number]  Arrival date and time: 06/09/16 6962   First Provider Initiated Contact with Patient 06/09/16 [phone]      Chief Complaint  Patient presents with  . Emesis   Joanna Gray is a 20 y.o. G1P0 at [redacted] weeks gestation who has had ongoing nausea and vomiting. She has zofran 4mg  at home, but states that is not helping. She denies any VB or complications with the pregnancy at this time.    Emesis   This is a new problem. The current episode started more than 1 month ago. The problem occurs 5 to 10 times per day. The problem has been unchanged. The emesis has an appearance of stomach contents. There has been no fever. Associated symptoms include abdominal pain. Pertinent negatives include no chills or fever. Risk factors: pregnancy  Treatments tried: zofran  The treatment provided no relief.   Past Medical History:  Diagnosis Date  . Anxiety   . Bipolar disorder (HCC)   . Chlamydia   . Depression     No past surgical history on file.  No family history on file.  Social History  Substance Use Topics  . Smoking status: Current Every Day Smoker    Types: Cigarettes  . Smokeless tobacco: Never Used  . Alcohol use No    Allergies: No Known Allergies  Prescriptions Prior to Admission  Medication Sig Dispense Refill Last Dose  . cephALEXin (KEFLEX) 500 MG capsule Take 1 capsule (500 mg total) by mouth 2 (two) times daily. 20 capsule 0   . Doxylamine-Pyridoxine 10-10 MG TBEC Start: 2 tabs PO qhs; if sx persist after 2 days, incr. to 1 tab PO qam and 2 tabs PO qhs; may further incr. to 1 tab PO qam, 1 tab PO every mid-afternoon, and 2 tabs PO qhs; Max: 4 tabs/day; Info: give on empty stomach; do not cut/crush/chew 60 tablet 0   . metroNIDAZOLE (FLAGYL) 500 MG tablet Take 1 tablet (500 mg total) by mouth 2 (two) times daily. One tab PO bid x 10 days 20 tablet 0   . potassium chloride SA (K-DUR,KLOR-CON) 20 MEQ tablet Take 1 tablet (20 mEq total) by mouth daily.  3 tablet 0   . Prenatal Vit-Fe Fumarate-FA (PRENATAL COMPLETE) 14-0.4 MG TABS Take 1 tablet by mouth daily. 60 each 1     Review of Systems  Constitutional: Negative for chills and fever.  Gastrointestinal: Positive for abdominal pain, heartburn, nausea and vomiting. Negative for constipation.  Genitourinary: Negative for dysuria, frequency and urgency.   Physical Exam   Blood pressure 127/74, pulse 106, temperature 97.3 F (36.3 C), temperature source Oral, resp. rate 18, height 5\' 4"  (1.626 m), weight 129 lb (58.5 kg), last menstrual period 03/02/2016.  Physical Exam  Nursing note and vitals reviewed. Constitutional: She is oriented to person, place, and time. She appears well-developed and well-nourished. No distress.  HENT:  Head: Normocephalic.  Cardiovascular: Normal rate.   Respiratory: Effort normal.  GI: Soft. There is no tenderness.  Genitourinary:  Genitourinary Comments:  FHT 160 with doppler   Neurological: She is alert and oriented to person, place, and time.  Skin: Skin is warm and dry.  Psychiatric: She has a normal mood and affect.   Results for orders placed or performed during the hospital encounter of 06/09/16 (from the past 24 hour(s))  Urinalysis, Routine w reflex microscopic (not at [facility])     Status: Abnormal   Collection Time: 06/09/16 12:05 PM  Result  Value Ref Range   Color, Urine YELLOW YELLOW   APPearance CLEAR CLEAR   Specific Gravity, Urine 1.020 1.005 - 1.030   pH 8.5 (H) 5.0 - 8.0   Glucose, UA 500 (A) NEGATIVE mg/dL   Hgb urine dipstick NEGATIVE NEGATIVE   Bilirubin Urine NEGATIVE NEGATIVE   Ketones, ur 15 (A) NEGATIVE mg/dL   Protein, ur NEGATIVE NEGATIVE mg/dL   Nitrite NEGATIVE NEGATIVE   Leukocytes, UA NEGATIVE NEGATIVE  '  MAU Course  Procedures  MDM RN found patient putting fingers down her throat. When asked the patient stated that she felt like she needed to vomit, and she thought making herself vomit would help. Advised  patient to stop, and allow the medications to work. Patient has had 1L D5LR with phenergan, 1L of LR, zofran IV, pepcid IV, scopolamine patch. 1246: D/W Dr. Rana SnareLowe, ok for DC home with phenergan and zofran ODT.   Assessment and Plan   1. Nausea and vomiting during pregnancy    DC home Comfort measures reviewed  2nd Trimester precautions  RX: phenergan PRN, Zofran PRN  Return to MAU as needed FU with OB as planned  Follow-up Information    LOWE,DAVID C, MD .   Specialty:  Obstetrics and Gynecology Contact information: [address], [address] [place] [id number] [phone]            Tawnya CrookHogan, Rontrell Moquin Donovan 06/09/2016, 9:02 AM

## 2016-06-09 NOTE — Progress Notes (Signed)
Patient continues to stick her finger down her throat to try and vomit. Discussed with the patient the importance of not sticking her finger down her throat to try and vomit because that is not letting the medicine work. Will continue to monitor.

## 2016-06-16 ENCOUNTER — Telehealth (HOSPITAL_BASED_OUTPATIENT_CLINIC_OR_DEPARTMENT_OTHER): Payer: Self-pay | Admitting: Emergency Medicine

## 2016-06-16 NOTE — Telephone Encounter (Signed)
Lost to followup 

## 2016-08-23 NOTE — L&D Delivery Note (Signed)
Delivery Note At 7:18 PM a viable female was delivered via Vaginal, Spontaneous Delivery OA Presentation  APGAR: 8, 9; weight pending  .   Placenta status:spontaneously with 3 vessel cord , .  Cord:  with the following complications:none .  Cord pH: not obtained  Anesthesia:  epidural Episiotomy: None Lacerations: None Suture Repair: not applicable Est. Blood Loss (mL): 300  Mom to postpartum.  Baby to Couplet care / Skin to Skin.  Lamond Glantz L 11/03/2016, 7:26 PM

## 2016-11-03 ENCOUNTER — Inpatient Hospital Stay (HOSPITAL_COMMUNITY): Admitting: Anesthesiology

## 2016-11-03 ENCOUNTER — Inpatient Hospital Stay (HOSPITAL_COMMUNITY)
Admission: AD | Admit: 2016-11-03 | Discharge: 2016-11-05 | DRG: 775 | Disposition: A | Discharge status: Home / Self Care | Source: Ambulatory Visit | Attending: Obstetrics and Gynecology | Admitting: Obstetrics and Gynecology

## 2016-11-03 ENCOUNTER — Encounter (HOSPITAL_COMMUNITY): Payer: Self-pay | Admitting: *Deleted

## 2016-11-03 DIAGNOSIS — Z3A36 36 weeks gestation of pregnancy: Secondary | ICD-10-CM

## 2016-11-03 DIAGNOSIS — Z87891 Personal history of nicotine dependence: Secondary | ICD-10-CM

## 2016-11-03 DIAGNOSIS — Z3490 Encounter for supervision of normal pregnancy, unspecified, unspecified trimester: Secondary | ICD-10-CM

## 2016-11-03 LAB — CBC
HCT: 39.5 % (ref 36.0–46.0)
Hemoglobin: 13.8 g/dL (ref 12.0–15.0)
MCH: 33.2 pg (ref 26.0–34.0)
MCHC: 34.9 g/dL (ref 30.0–36.0)
MCV: 95 fL (ref 78.0–100.0)
PLATELETS: 233 10*3/uL (ref 150–400)
RBC: 4.16 MIL/uL (ref 3.87–5.11)
RDW: 12.9 % (ref 11.5–15.5)
WBC: 15.1 10*3/uL — ABNORMAL HIGH (ref 4.0–10.5)

## 2016-11-03 LAB — TYPE AND SCREEN
ABO/RH(D): A POS
Antibody Screen: NEGATIVE

## 2016-11-03 LAB — ABO/RH: ABO/RH(D): A POS

## 2016-11-03 MED ORDER — ACETAMINOPHEN 325 MG PO TABS
650.0000 mg | ORAL_TABLET | ORAL | Status: DC | PRN
  Filled 2016-11-03: qty 2

## 2016-11-03 MED ORDER — OXYCODONE-ACETAMINOPHEN 5-325 MG PO TABS: 1.0000 | ORAL_TABLET | ORAL | Status: DC | PRN

## 2016-11-03 MED ORDER — SOD CITRATE-CITRIC ACID 500-334 MG/5ML PO SOLN: 30.0000 mL | ORAL | Status: DC | PRN

## 2016-11-03 MED ORDER — PRENATAL MULTIVITAMIN CH
1.0000 | ORAL_TABLET | Freq: Every day | ORAL | Status: DC
  Administered 2016-11-04 – 2016-11-05 (×2): 1 via ORAL
  Filled 2016-11-03 (×2): qty 1

## 2016-11-03 MED ORDER — LIDOCAINE HCL (PF) 1 % IJ SOLN
30.0000 mL | INTRAMUSCULAR | Status: DC | PRN
  Filled 2016-11-03: qty 30

## 2016-11-03 MED ORDER — ONDANSETRON HCL 4 MG/2ML IJ SOLN: 4.0000 mg | INTRAMUSCULAR | Status: DC | PRN

## 2016-11-03 MED ORDER — OXYTOCIN 40 UNITS IN LACTATED RINGERS INFUSION - SIMPLE MED
2.5000 [IU]/h | INTRAVENOUS | Status: DC
Start: 2016-11-03 — End: 2016-11-03
  Filled 2016-11-03: qty 1000

## 2016-11-03 MED ORDER — LACTATED RINGERS IV SOLN
500.0000 mL | Freq: Once | INTRAVENOUS | Status: AC
  Administered 2016-11-03: 500 mL via INTRAVENOUS

## 2016-11-03 MED ORDER — PHENYLEPHRINE 40 MCG/ML (10ML) SYRINGE FOR IV PUSH (FOR BLOOD PRESSURE SUPPORT)
80.0000 ug | PREFILLED_SYRINGE | INTRAVENOUS | Status: DC | PRN
  Filled 2016-11-03: qty 5

## 2016-11-03 MED ORDER — DIBUCAINE 1 % RE OINT: 1.0000 "application " | TOPICAL_OINTMENT | RECTAL | Status: DC | PRN

## 2016-11-03 MED ORDER — MEDROXYPROGESTERONE ACETATE 150 MG/ML IM SUSP: 150.0000 mg | INTRAMUSCULAR | Status: DC | PRN

## 2016-11-03 MED ORDER — LACTATED RINGERS IV SOLN: INTRAVENOUS | Status: DC

## 2016-11-03 MED ORDER — FENTANYL 2.5 MCG/ML BUPIVACAINE 1/10 % EPIDURAL INFUSION (WH - ANES)
14.0000 mL/h | INTRAMUSCULAR | Status: DC | PRN
  Administered 2016-11-03: 14 mL/h via EPIDURAL
  Filled 2016-11-03: qty 100

## 2016-11-03 MED ORDER — EPHEDRINE 5 MG/ML INJ
10.0000 mg | INTRAVENOUS | Status: DC | PRN
  Filled 2016-11-03: qty 4

## 2016-11-03 MED ORDER — OXYTOCIN 10 UNIT/ML IJ SOLN
INTRAMUSCULAR | Status: AC
  Administered 2016-11-03: 10 [IU] via INTRAMUSCULAR
  Filled 2016-11-03: qty 1

## 2016-11-03 MED ORDER — ONDANSETRON HCL 4 MG PO TABS: 4.0000 mg | ORAL_TABLET | ORAL | Status: DC | PRN

## 2016-11-03 MED ORDER — SIMETHICONE 80 MG PO CHEW: 80.0000 mg | CHEWABLE_TABLET | ORAL | Status: DC | PRN

## 2016-11-03 MED ORDER — SODIUM CHLORIDE 0.9 % IV SOLN
2.0000 g | Freq: Once | INTRAVENOUS | Status: AC
  Administered 2016-11-03: 2 g via INTRAVENOUS
  Filled 2016-11-03: qty 2000

## 2016-11-03 MED ORDER — ZOLPIDEM TARTRATE 5 MG PO TABS: 5.0000 mg | ORAL_TABLET | Freq: Every evening | ORAL | Status: DC | PRN

## 2016-11-03 MED ORDER — ONDANSETRON HCL 4 MG/2ML IJ SOLN: 4.0000 mg | Freq: Four times a day (QID) | INTRAMUSCULAR | Status: DC | PRN

## 2016-11-03 MED ORDER — PHENYLEPHRINE 40 MCG/ML (10ML) SYRINGE FOR IV PUSH (FOR BLOOD PRESSURE SUPPORT)
80.0000 ug | PREFILLED_SYRINGE | INTRAVENOUS | Status: DC | PRN
  Filled 2016-11-03: qty 5
  Filled 2016-11-03: qty 10

## 2016-11-03 MED ORDER — TETANUS-DIPHTH-ACELL PERTUSSIS 5-2.5-18.5 LF-MCG/0.5 IM SUSP: 0.5000 mL | Freq: Once | INTRAMUSCULAR | Status: DC

## 2016-11-03 MED ORDER — BISACODYL 10 MG RE SUPP: 10.0000 mg | Freq: Every day | RECTAL | Status: DC | PRN

## 2016-11-03 MED ORDER — SENNOSIDES-DOCUSATE SODIUM 8.6-50 MG PO TABS
2.0000 | ORAL_TABLET | ORAL | Status: DC
  Administered 2016-11-03: 2 via ORAL
  Filled 2016-11-03 (×2): qty 2

## 2016-11-03 MED ORDER — WITCH HAZEL-GLYCERIN EX PADS: 1.0000 "application " | MEDICATED_PAD | CUTANEOUS | Status: DC | PRN

## 2016-11-03 MED ORDER — OXYCODONE-ACETAMINOPHEN 5-325 MG PO TABS: 2.0000 | ORAL_TABLET | ORAL | Status: DC | PRN

## 2016-11-03 MED ORDER — COCONUT OIL OIL: 1.0000 "application " | TOPICAL_OIL | Status: DC | PRN

## 2016-11-03 MED ORDER — IBUPROFEN 600 MG PO TABS
600.0000 mg | ORAL_TABLET | Freq: Four times a day (QID) | ORAL | Status: DC
  Administered 2016-11-03 – 2016-11-05 (×6): 600 mg via ORAL
  Filled 2016-11-03 (×7): qty 1

## 2016-11-03 MED ORDER — MEASLES, MUMPS & RUBELLA VAC ~~LOC~~ INJ
0.5000 mL | INJECTION | Freq: Once | SUBCUTANEOUS | Status: DC
  Filled 2016-11-03: qty 0.5

## 2016-11-03 MED ORDER — OXYTOCIN BOLUS FROM INFUSION: 500.0000 mL | Freq: Once | INTRAVENOUS | Status: DC

## 2016-11-03 MED ORDER — LIDOCAINE HCL (PF) 1 % IJ SOLN
INTRAMUSCULAR | Status: DC | PRN
  Administered 2016-11-03: 8 mL via EPIDURAL

## 2016-11-03 MED ORDER — FLEET ENEMA 7-19 GM/118ML RE ENEM: 1.0000 | ENEMA | Freq: Every day | RECTAL | Status: DC | PRN

## 2016-11-03 MED ORDER — DIPHENHYDRAMINE HCL 25 MG PO CAPS: 25.0000 mg | ORAL_CAPSULE | Freq: Four times a day (QID) | ORAL | Status: DC | PRN

## 2016-11-03 MED ORDER — DIPHENHYDRAMINE HCL 50 MG/ML IJ SOLN: 12.5000 mg | INTRAMUSCULAR | Status: DC | PRN

## 2016-11-03 MED ORDER — BENZOCAINE-MENTHOL 20-0.5 % EX AERO: 1.0000 "application " | INHALATION_SPRAY | CUTANEOUS | Status: DC | PRN

## 2016-11-03 MED ORDER — ACETAMINOPHEN 325 MG PO TABS: 650.0000 mg | ORAL_TABLET | ORAL | Status: DC | PRN

## 2016-11-03 MED ORDER — LACTATED RINGERS IV SOLN: 500.0000 mL | INTRAVENOUS | Status: DC | PRN

## 2016-11-03 NOTE — Anesthesia Procedure Notes (Signed)
Epidural Patient location during procedure: OB Start time: 11/03/2016 6:41 PM End time: 11/03/2016 6:45 PM  Staffing Anesthesiologist: Leilani AbleHATCHETT, Carletta Feasel Performed: anesthesiologist   Preanesthetic Checklist Completed: patient identified, surgical consent, pre-op evaluation, timeout performed, IV checked, risks and benefits discussed and monitors and equipment checked  Epidural Patient position: sitting Prep: site prepped and draped and DuraPrep Patient monitoring: continuous pulse ox and blood pressure Approach: midline Location: L3-L4 Injection technique: LOR air  Needle:  Needle type: Tuohy  Needle gauge: 17 G Needle length: 9 cm and 9 Needle insertion depth: 5 cm cm Catheter type: closed end flexible Catheter size: 19 Gauge Catheter at skin depth: 10 cm Test dose: negative  Assessment Events: blood not aspirated, injection not painful, no injection resistance, negative IV test and no paresthesia  Additional Notes Reason for block:procedure for pain

## 2016-11-03 NOTE — MAU Note (Signed)
Pt sent from the office due to SVE was 4 cm. "kind-of having contractions"

## 2016-11-03 NOTE — H&P (Signed)
Joanna Gray is a 21 y.o.G 1 P 0 at 36 w 2 days presents in labor.  OB History    Gravida Para Term Preterm AB Living   1             SAB TAB Ectopic Multiple Live Births                 Past Medical History:  Diagnosis Date  . Anxiety   . Bipolar disorder (HCC)   . Chlamydia   . Depression   . Headache    Past Surgical History:  Procedure Laterality Date  . NO PAST SURGERIES     Family History: family history is not on file. Social History:  reports that she quit smoking about 6 months ago. Her smoking use included Cigarettes. She has never used smokeless tobacco. She reports that she does not drink alcohol or use drugs.     Maternal Diabetes: No Genetic Screening: Normal Maternal Ultrasounds/Referrals: Normal Fetal Ultrasounds or other Referrals:  None Maternal Substance Abuse:  No Significant Maternal Medications:  None Significant Maternal Lab Results:  None Other Comments:  None  Review of Systems  All other systems reviewed and are negative.  Maternal Medical History:  Reason for admission: Contractions.     Dilation: 6.5 Exam by:: Fujie Dickison ,MD Blood pressure 121/83, pulse 78, temperature 98.4 F (36.9 C), temperature source Oral, resp. rate 15, height 5\' 4"  (1.626 m), weight 68.5 kg (151 lb), last menstrual period 03/02/2016, SpO2 100 %. Maternal Exam:  Abdomen: Fetal presentation: vertex     Fetal Exam Fetal State Assessment: Category I - tracings are normal.     Physical Exam  Nursing note and vitals reviewed. Constitutional: She appears well-developed and well-nourished.  HENT:  Head: Normocephalic and atraumatic.  Eyes: Pupils are equal, round, and reactive to light.  Neck: Normal range of motion.  Cardiovascular: Normal rate and regular rhythm.   Respiratory: Effort normal.  GI: Soft.    Prenatal labs: ABO, Rh:   Antibody:   Rubella:   RPR: Non Reactive (08/12 1155)  HBsAg:    HIV: Non Reactive (08/12 1155)  GBS:      Assessment/Plan: IUP at 36 w 2 days Labor Admit Epidural Anticipate NSVD Evyn Kooyman L 11/03/2016, 4:25 PM

## 2016-11-03 NOTE — Anesthesia Preprocedure Evaluation (Signed)
Anesthesia Evaluation  Patient identified by MRN, date of birth, ID band Patient awake    Reviewed: Allergy & Precautions, H&P , NPO status , Patient's Chart, lab work & pertinent test results  Airway Mallampati: II  TM Distance: >3 FB Neck ROM: full    Dental no notable dental hx.    Pulmonary neg pulmonary ROS, former smoker,    Pulmonary exam normal        Cardiovascular negative cardio ROS Normal cardiovascular exam     Neuro/Psych negative psych ROS   GI/Hepatic negative GI ROS, Neg liver ROS,   Endo/Other  negative endocrine ROS  Renal/GU negative Renal ROS     Musculoskeletal   Abdominal Normal abdominal exam  (+)   Peds  Hematology negative hematology ROS (+)   Anesthesia Other Findings   Reproductive/Obstetrics (+) Pregnancy                             Anesthesia Physical Anesthesia Plan  ASA: II  Anesthesia Plan: Epidural   Post-op Pain Management:    Induction:   Airway Management Planned:   Additional Equipment:   Intra-op Plan:   Post-operative Plan:   Informed Consent: I have reviewed the patients History and Physical, chart, labs and discussed the procedure including the risks, benefits and alternatives for the proposed anesthesia with the patient or authorized representative who has indicated his/her understanding and acceptance.     Plan Discussed with: CRNA and Surgeon  Anesthesia Plan Comments:         Anesthesia Quick Evaluation

## 2016-11-04 LAB — CBC
HEMATOCRIT: 30.6 % — AB (ref 36.0–46.0)
HEMOGLOBIN: 11.1 g/dL — AB (ref 12.0–15.0)
MCH: 34 pg (ref 26.0–34.0)
MCHC: 36.3 g/dL — AB (ref 30.0–36.0)
MCV: 93.9 fL (ref 78.0–100.0)
Platelets: 194 10*3/uL (ref 150–400)
RBC: 3.26 MIL/uL — ABNORMAL LOW (ref 3.87–5.11)
RDW: 12.8 % (ref 11.5–15.5)
WBC: 15.8 10*3/uL — AB (ref 4.0–10.5)

## 2016-11-04 LAB — RPR: RPR: NONREACTIVE

## 2016-11-04 LAB — RUBELLA SCREEN: RUBELLA: 2.85 {index} (ref >0.99)

## 2016-11-04 NOTE — Progress Notes (Signed)
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  Parents report that FOB's parents are very supportive.)   Natural Supports (not living in the home):  Immediate Family (MOB reports that her mother is supportive and traveling from [place] to be with her.  Her sister/Allyssa (26) is here visiting now and appears very supportive.)   Professional Supports: None   Employment:     Type of Work:     Education:      Pensions consultant:  Medicaid (MOB has [place] from her mother.  She plans to apply for Medicaid for baby and states she already has an appointment.)   Other Resources:      Cultural/Religious Considerations Which May Impact Care: None stated.  Strengths:  Home prepared for child , Ability to meet basic needs  (MOB requests pediatrician list.  CSW provided.)   Risk Factors/Current Problems:  Mental Health Concerns , Substance Use    Cognitive State:  Able to Concentrate , Alert , Linear Thinking    Mood/Affect:  Calm , Comfortable , Happy , Interested    CSW Assessment: CSW met with MOB, FOB and MOB's sister in MOB's first floor room/141 to offer support and complete  assessment due to marijuana use, Bipolar and Anxiety.  Family was pleasant and welcoming.  MOB gave permission to speak openly with family present.  FOB played on his cell phone for the entirety of the conversation.  MOB and her sister were easy to engage.  CSW asked FOB his name.  MOB informed CSW that his first name is spelled "Edi."  CSW asked FOB how to spell his last name and he seemed irritated, replying, "L-e-singer."  MOB made an annoyed face at her sister in response to FOB's attitude.  CSW looked at [facility] and she smiled. CSW inquired about how MOB felt emotionally throughout pregnancy.  She reports feeling well with no emotional concerns.  She states she felt awful physically throughout the entire pregnancy and vomited almost daily.  CSW acknowledged mental health concerns noted in April of 2017 and asked about any ongoing symptoms or treatment.  MOB was somewhat guarded in her discussion of her mental health history and told CSW that she is "fine" now since she is no longer living with her mother.  She reports that her mother is a good support for her, but that they are not meant to live together.  MOB's sister confirmed that she thinks MOB is doing very well at this time since she is no longer living with their mother.  She also confirmed that their mother is supportive, but can be difficult.  CSW spoke at length about PMADs and the importance  of reporting any concerns to a medical professional.  MOB was receptive to information and resources provided.  CSW gave MOB information for walk-in clinics at [facility] and [facility] as well as information on support groups held at [place] provided MOB with a New Mom Checklist from Postpartum Progress and asked that MOB use this as a self-evaluation tool throughout the postpartum period.  CSW advised that MOB call a doctor or counselor if she notes concerns at any time.  MOB agreed.  MOB reports feeling "happy" about baby and notes  that she "can't stop staring at her."  MOB held and looked at baby lovingly. CSW asked parents if they are aware of SIDS precautions and they stated they were not.  CSW provided information and parents stated agreement to abide by recommendations.   CSW informed MOB of baby's positive UDS for marijuana and assessed hx of substance use.  MOB became flush and began fanning herself.  She denies all substances other than marijuana and reports that she smoked in order to help with her nausea.  She estimates her last use was 2 months ago.  CSW notes hx of illegal Xanax use noted in April 2017, but MOB did not admit this use currently.  CSW explained hospital drug screen policy and mandated reporting to Child Protective Services.  MOB and her sister stated understanding.  CSW explained what to expect in general from CPS involvement and that a worker may come to the hospital to meet with them or may wait until they are home to complete their assessment.  MOB stated understanding.   Report made to CPS.  CSW will follow up in the morning to see what response time the case was given.  In this case, CSW recommends waiting to hear from [facility] before discharging infant.  CSW Plan/Description:  Child Copy Report , Information/Referral to [employer] , Patient/Family Education     Terri Piedra [place], [place] 11/04/2016, 3:24 PM

## 2016-11-04 NOTE — Progress Notes (Signed)
Post Partum Day 1 Subjective: no complaints, up ad lib, voiding, tolerating PO and + flatus  Objective: Blood pressure 106/62, pulse (!) 57, temperature 98.2 F (36.8 C), temperature source Oral, resp. rate 18, height 5\' 4"  (1.626 m), weight 151 lb (68.5 kg), last menstrual period 03/02/2016, SpO2 100 %, unknown if currently breastfeeding.  Physical Exam:  General: alert and cooperative Lochia: appropriate Uterine Fundus: firm Incision: healing well DVT Evaluation: No evidence of DVT seen on physical exam. Negative Homan's sign. No cords or calf tenderness. No significant calf/ankle edema.   Recent Labs  11/03/16 1635 11/04/16 0615  HGB 13.8 11.1*  HCT 39.5 30.6*    Assessment/Plan: Plan for discharge tomorrow and Breastfeeding   LOS: 1 day   [place] Subramaniam G 11/04/2016, 8:39 AM

## 2016-11-04 NOTE — Anesthesia Postprocedure Evaluation (Signed)
Anesthesia Post Note  Patient: Joanna Gray  Procedure(s) Performed: * No procedures listed *  Patient location during evaluation: Mother Baby Anesthesia Type: Epidural Level of consciousness: awake Pain management: pain level controlled Vital Signs Assessment: post-procedure vital signs reviewed and stable Respiratory status: spontaneous breathing Cardiovascular status: stable Postop Assessment: no headache, no backache, epidural receding and patient able to bend at knees Anesthetic complications: no        Last Vitals:  Vitals:   11/04/16 0315 11/04/16 0912  BP: 106/62 116/67  Pulse: (!) 57 67  Resp:  18  Temp: 36.8 C 36.8 C    Last Pain:  Vitals:   11/04/16 1141  TempSrc:   PainSc: 1    Pain Goal: Patients Stated Pain Goal: 4 (11/03/16 1759)               Edison PaceWILKERSON,Eniyah [place]

## 2016-11-04 NOTE — Lactation Note (Signed)
This note was copied from a baby's chart. Lactation Consultation Note  Patient Name: Joanna Gray ZOXWR'UToday's Date: 11/04/2016   Baby 18 hours old. Mom has a visitor in the room [facility]  Maternal Data    Feeding Feeding Type: Breast Fed  LATCH Score/Interventions Latch: Repeated attempts needed to sustain latch, nipple held in mouth throughout feeding, stimulation needed to elicit sucking reflex. Intervention(s): Skin to skin Intervention(s): Assist with latch;Adjust position  Audible Swallowing: A few with stimulation Intervention(s): Hand expression  Type of Nipple: Everted at rest and after stimulation  Comfort (Breast/Nipple): Soft / non-tender     Hold (Positioning): Assistance needed to correctly position infant at breast and maintain latch.  LATCH Score: 7  Lactation Tools Discussed/Used     Consult Status      Sherlyn HayJennifer D Deiondra Denley 11/04/2016, 1:39 PM

## 2016-11-04 NOTE — Anesthesia Postprocedure Evaluation (Signed)
Anesthesia Post Note  Patient: Joanna Gray  Procedure(s) Performed: * No procedures listed *  Patient location during evaluation: Mother Baby Anesthesia Type: Epidural Level of consciousness: awake and alert Pain management: pain level controlled Vital Signs Assessment: post-procedure vital signs reviewed and stable Respiratory status: spontaneous breathing, nonlabored ventilation and respiratory function stable Cardiovascular status: stable Postop Assessment: no headache, no backache and epidural receding Anesthetic complications: no        Last Vitals:  Vitals:   11/03/16 2315 11/04/16 0315  BP: (!) 114/59 106/62  Pulse: 65 (!) 57  Resp: 18   Temp: 36.9 C 36.8 C    Last Pain:  Vitals:   11/04/16 0634  TempSrc:   PainSc: Asleep   Pain Goal: Patients Stated Pain Goal: 4 (11/03/16 1759)               Marrion CoyMERRITT,Odalis Jordan

## 2016-11-04 NOTE — Lactation Note (Signed)
This note was copied from a baby's chart. Lactation Consultation Note  Patient Name: Joanna Gray NFAOZ'HToday's Date: 11/04/2016 Reason for consult: Initial assessment;Infant < 6lbs;Late preterm infant  Visited with first time Mom of 5581w2d baby, baby at 20 hrs.  Baby's weight of 5 lbs 3.8 oz.  Mom BFing and pumping (3 times so far)  Encouraged hand expressing and use of DEBP.  Baby has had a few BFings, 10 minutes, but recently has been sleepy.  Tried to BF, but baby very sleepy and would not root and latch.  Baby getting bath, and LC assisted with hand expression, able to express 5 ml colostrum.  Mom to feed baby her EBM by spoon. Recommended supplementing per LPTI protocol.  Talked to Mom about Alimentum use if EBM not to amount needed. Pace method of bottle feeding reviewed. Brochure left in room.  Explained about IP and OP lactation services available to her.   Reviewed importance of STS and cue based feedings.  Importance of waking baby for feedings at 3 hrs.     Consult Status Consult Status: Follow-up Date: 11/05/16 Follow-up type: In-patient    Joanna Gray, Joanna Gray 11/04/2016, 3:39 PM

## 2016-11-05 MED ORDER — IBUPROFEN 600 MG PO TABS: 600.0000 mg | ORAL_TABLET | Freq: Four times a day (QID) | ORAL | 0 refills | Status: AC | PRN

## 2016-11-05 MED ORDER — DOCUSATE SODIUM 100 MG PO CAPS: 100.0000 mg | ORAL_CAPSULE | Freq: Two times a day (BID) | ORAL | 2 refills | Status: AC

## 2016-11-05 MED ORDER — FERROUS SULFATE 325 (65 FE) MG PO TBEC: 325.0000 mg | DELAYED_RELEASE_TABLET | Freq: Two times a day (BID) | ORAL | 2 refills | Status: AC

## 2016-11-05 NOTE — Lactation Note (Addendum)
This note was copied from a baby's chart. Lactation Consultation Note  Patient Name: Joanna [name]: 11/05/2015 Reason for consult: Follow-up assessment;Infant < 6lbs;Late preterm infant  LPI, 4lb 14.1 oz baby, 6839 hours old. Mom reports that she is putting the baby to breast with cues but baby often sleepy. Reviewed LPI behavior and guidelines. Assisted mom with latching baby to breast, and baby latched well initially, but baby quickly sleepy at the breast. Assisted FOB to give a bottle, demonstrated how to hold the baby upright and pace feed, but FOB concerned with how to hold the baby--himself as well as this LC. Discussed with FOB how important it is that the baby is fed with cues and at least every 3 hours. Discussed with parents the importance of supplementing baby with each feeding. Maternal grandmother in the room enc mom to breastfeed, discussed the importance of supplementing baby to make sure baby getting enough volume/calories. Discussed how quickly the baby can lose weight.   Baby able to take 7 ml of formula after nursing with this LC's assistance. FOB still feeding baby when this LC left the room. Enc parents to keep total feeding time to 30 minutes. Enc parents to offer at 20 ml of EBM/formula with each feeding, and then follow guidelines--which were reviewed--for increases in supplementation amounts. Mom using DEBP while baby being supplemented by bottle. Mom getting about 1 ml from each breast. Enc mom to hand express after pumping. Maternal grandmother stated that she is purchasing mom a DEBP online.   Enc parents to work on feeding. Plan is to put baby to breast with cues and at least every 3 hours--waking as needed. Enc parents to supplement with EBM/formula according to guidelines, and then for mom to post-pump followed by hand expression.   Discussed assessment and interventions with patient's bedside nurse, Joanna BumpsJessica, RN who reports that the baby is not being  discharged today.  Maternal Data    Feeding Feeding Type: Breast Fed Length of feed: 15 min  LATCH Score/Interventions Latch: Grasps breast easily, tongue down, lips flanged, rhythmical sucking. Intervention(s): Skin to skin;Waking techniques Intervention(s): Adjust position;Assist with latch;Breast compression  Audible Swallowing: A few with stimulation Intervention(s): Hand expression  Type of Nipple: Everted at rest and after stimulation  Comfort (Breast/Nipple): Soft / non-tender     Hold (Positioning): Assistance needed to correctly position infant at breast and maintain latch. Intervention(s): Breastfeeding basics reviewed;Support Pillows;Position options;Skin to skin  LATCH Score: 8  Lactation Tools Discussed/Used     Consult Status Consult Status: Follow-up [name]: 11/06/16 Follow-up type: In-patient    Joanna [name] 11/05/2016, 10:24 AM

## 2016-11-05 NOTE — Discharge Summary (Signed)
Obstetric Discharge Summary Reason for Admission: onset of labor Prenatal Procedures: none Intrapartum Procedures: spontaneous vaginal delivery Postpartum Procedures: none Complications-Operative and Postpartum: PTL Hemoglobin  Date Value Ref Range Status  11/04/2016 11.1 (L) 12.0 - 15.0 g/dL Final   HCT  Date Value Ref Range Status  11/04/2016 30.6 (L) 36.0 - 46.0 % Final    Physical Exam:  General: alert, cooperative and appears stated age 26Lochia: appropriate Uterine Fundus: firm Incision: N/A DVT Evaluation: No evidence of DVT seen on physical exam. Negative Homan's sign. No cords or calf tenderness. No significant calf/ankle edema.  Discharge Diagnoses: Premature labor and preterm infant delivered  Discharge Information: Date: 11/05/2016 Activity: pelvic rest Diet: routine Medications: Ibuprofen, Colace and Iron Condition: stable Instructions: refer to practice specific booklet Discharge to: home   Newborn Data: Live born female  Birth Weight: 5 lb 3.8 oz (2376 g) APGAR: 8, 9  Home with mother.  Joanna [name] 11/05/2016, 8:33 AM

## 2016-11-05 NOTE — Progress Notes (Signed)
CPS report accepted with a 72 hour response time.  Case assigned to B. [phone].  CPS worker may choose to initiate case while baby is still in the hospital, however, given response time, baby may discharge to MOB's care and CPS can follow up in the home.

## 2016-11-06 ENCOUNTER — Ambulatory Visit: Payer: Self-pay

## 2016-11-06 NOTE — Lactation Note (Signed)
This note was copied from a baby's chart. Lactation Consultation Note  Patient Name: Joanna Carney HarderKimberly Vasek ZOXWR'UToday's Date: 11/06/2016 Reason for consult: Follow-up assessment;Infant < 6lbs;Late preterm infant  -Visited with Mom, baby 863 hrs old.  Baby just fed 15 ml of EBM by bottle.  Mom pumped 40 ml at last pumping.  (Did not pump during the night)  Breasts are feeling fuller, and heavier.   -Reviewed LPTI volume parameters.  Today, baby should be supplemented 20-30 ml at least every 3 hrs, with a goal of >8 feedings per 24 hrs.  Mom seems a little unsure of plan of feeding.  After supplement given to baby, Mom put baby on the breast in cradle hold.  Assisted with using better support to make sure baby was latched deeply.  Baby noted to have regular swallowing.  Mom needing a lot of teaching and reinforcement on importance of supporting baby close to the breast. -Reviewed "triple feedings" need.  Baby to breast at least every 3 hrs (sooner if baby hungry, and limit to 30 minutes to avoid over tiring), supplement with EBM+/formula to equal 20-30 ml by slow flow bottle (pace method demonstrated), and pump both breasts 15-20 mins (along with hand expression and breast massage) -explained that baby can not rely on just exclusive breastfeeding yet.  If baby was not regularly and sufficiently supplemented, she would fail to gain weight, and possibly lose more weight.  Explained how jaundice can make baby sleepy at the breast. -Baby to be reweigh today, and will decide on discharge.   -Talked about importance of OP lactation appointment to follow up.  -Mom has a manual pump for home use, GMOB ordered a DEBP for home, but not delivered yet.  Discussed 2 week rental pump being the best idea, Mom unsure. -LC to follow up after discharge plan made.  Consult Status Consult Status: Follow-up Date: 11/06/16 Follow-up type: In-patient    Judee ClaraSmith, Alejandria Wessells E 11/06/2016, 11:02 AM

## 2016-11-07 ENCOUNTER — Ambulatory Visit: Payer: Self-pay

## 2016-11-07 NOTE — Lactation Note (Signed)
This note was copied from a baby's chart. Lactation Consultation Note  Patient Name: Joanna Gray ZOXWR'UToday's Date: 11/07/2016 Reason for consult: Follow-up assessment    With this mom of a LPI, now 5487 hours old, and at 5% weight loss, down from last weight. The baby is now 836 6/7 weeks CGa, and weighs 4 lbs 15.4 oz. I reviewed with mom  the need to pump at least 8 times a day, and also reviewed with her how to use hand expression.Mom did not pump during the night, or supplement baby after breastfeeding.  I also decreased mom to 21 flanges, to see if these were a better fit. I also had Debbie, Rn, bring mom coconut oil to use with pumping.  Mom seems to understand the information given to her, and was receptive to my teaching.  I also reviewed normal LPI behavior, and suggested she limit time at the breast to 15 minutes, so she has energy to bottle feed EBm after. Mom has a very good supply af EBm, and probably with need formula. at this point. Mom advised to side lie baby with feeding, and to offer up to 30 - 35 ml's after breast feeding, and increase by 5  Ml's at time, as baby  Seems to want more to eat.  Mom was able to hand express well, and with the 21 flanges, she felt comfortable, and her milk was flowing well.  MGM was bottle feeding, and baby seemed to be doing well.  Mom's DEP should arrive today, ` and if not available at time of discharge, mom is aware of our 2 week rental .   Maternal Data    Feeding Feeding Type: Bottle Fed - Breast Milk Nipple Type: Slow - flow  LATCH Score/Interventions                      Lactation Tools Discussed/Used WIC Program:  (tricare insuracnce - DEP should arrive today, as per MGM (3/18))   Consult Status Consult Status: Follow-up Date: 11/08/16 Follow-up type: In-patient    Alfred LevinsLee, Dezman Granda Anne 11/07/2016, 10:53 AM

## 2016-11-30 IMAGING — CT CT HEAD W/O CM
2 series · 16 of 30 positions shown, 18 images · non-contrast
Comparison: None.

CLINICAL DATA: 19-year-old female who tripped and fell on the
sidewalk yesterday with left side head injury. Severe headache and
nausea. Initial encounter.

EXAM:
CT HEAD WITHOUT CONTRAST
TECHNIQUE: Contiguous axial images were obtained from the base of the skull
through the vertex without intravenous contrast.

[Series 2: head wo · axial · 0.43mm/px · z∈[+1131,+1260]mm · 8 of 33 slices shown, 10 images]
[im 4/33  brain]
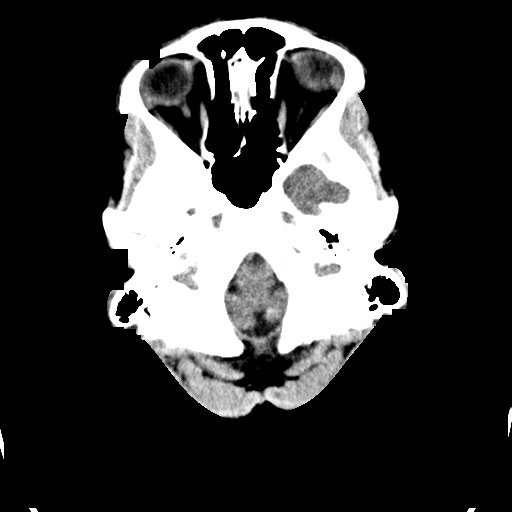
[im 4/33  bone]
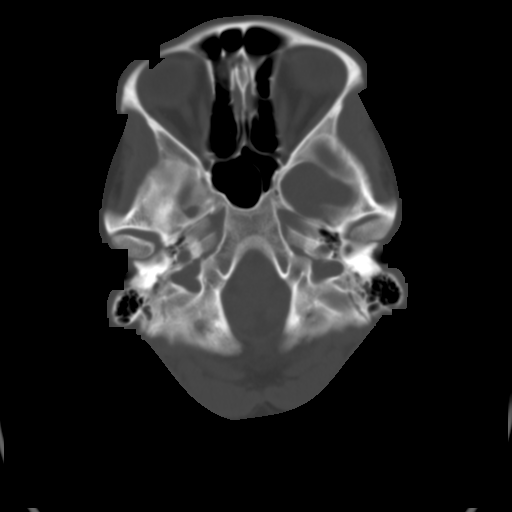
[im 8/33  brain]
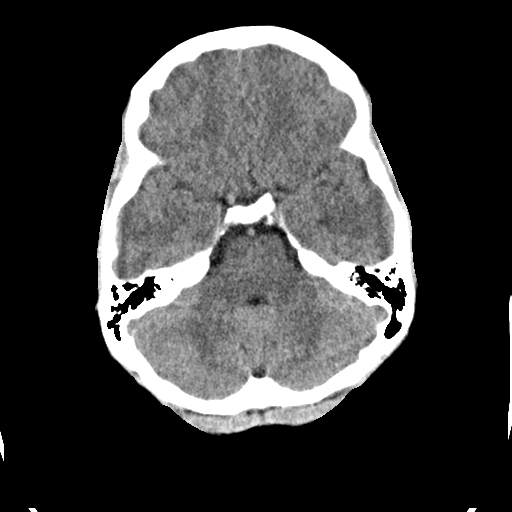
[im 11/33  brain]
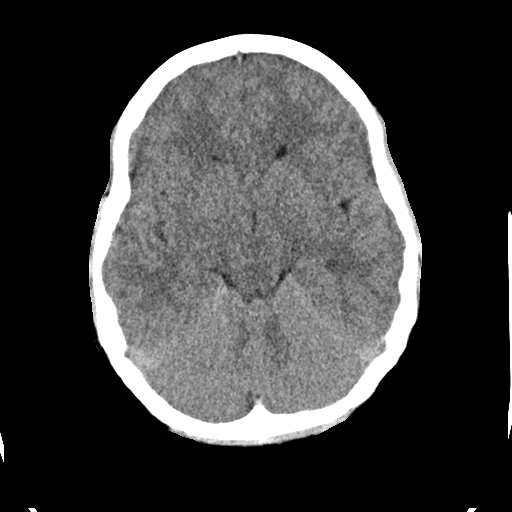
[im 15/33  brain]
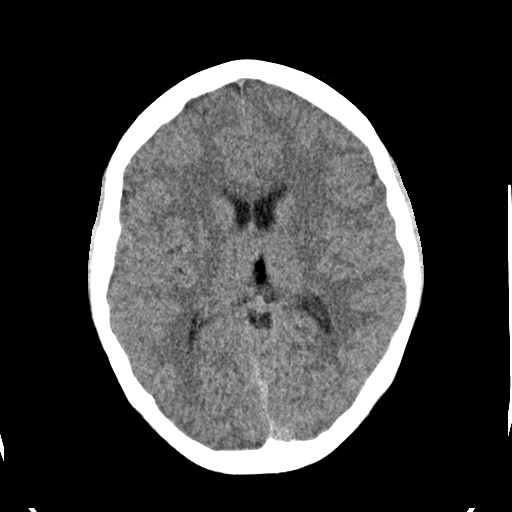
[im 18/33  brain]
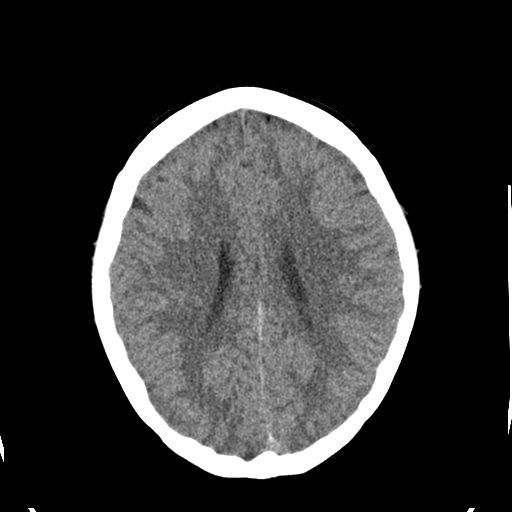
[im 18/33  bone]
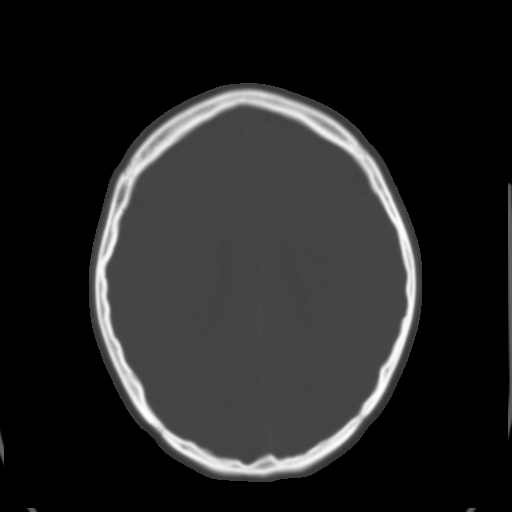
[im 22/33  brain]
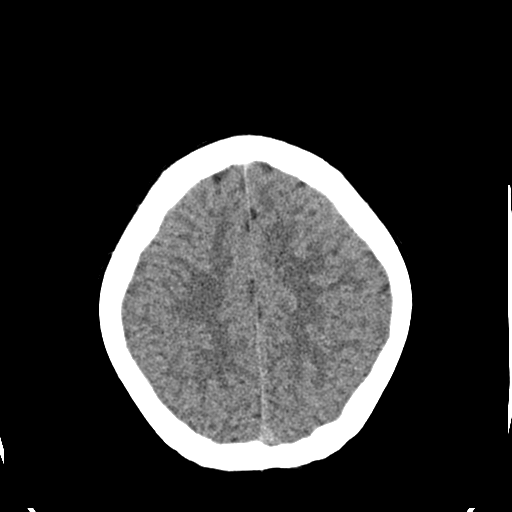
[im 25/33  brain]
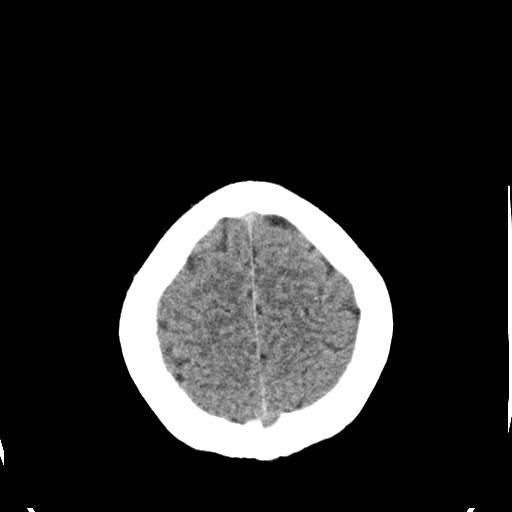
[im 29/33  brain]
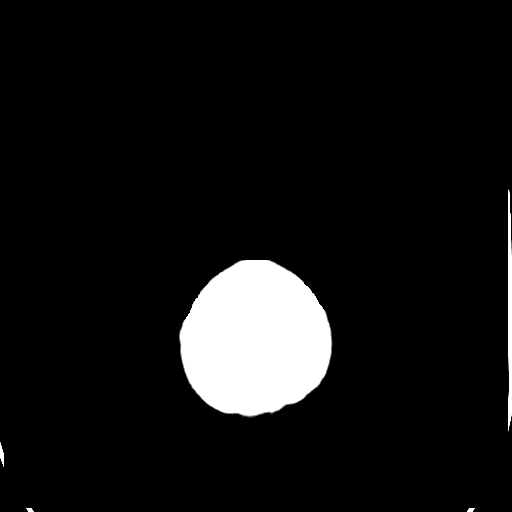

[Series 3: head bone · axial · 0.43mm/px · z∈[+1130,+1264]mm · 8 of 66 slices shown]
[im 7/66  bone]
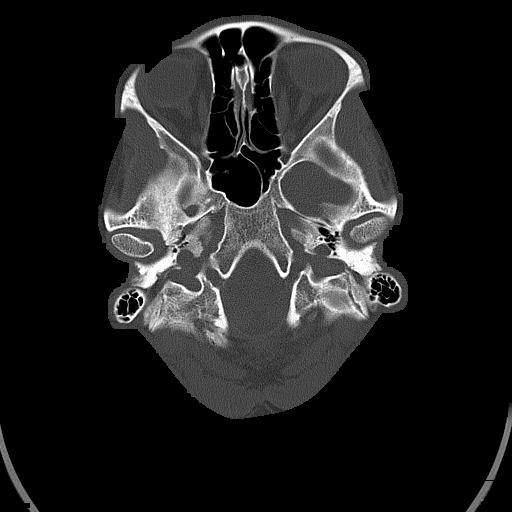
[im 14/66  bone]
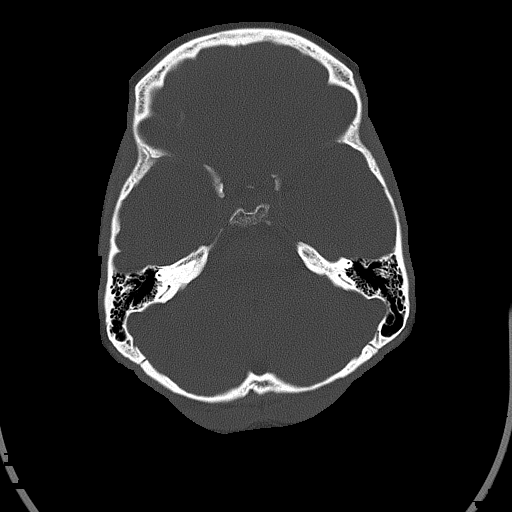
[im 21/66  bone]
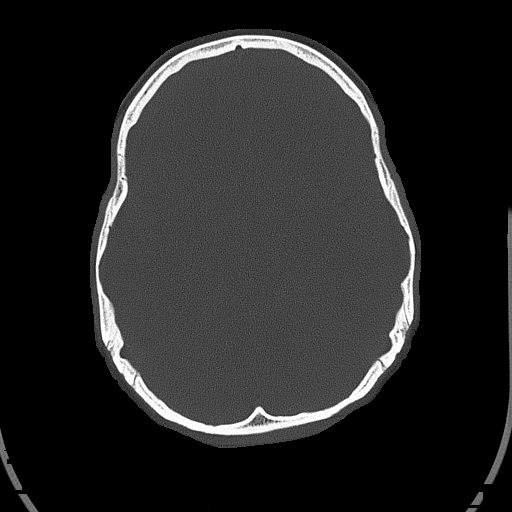
[im 28/66  bone]
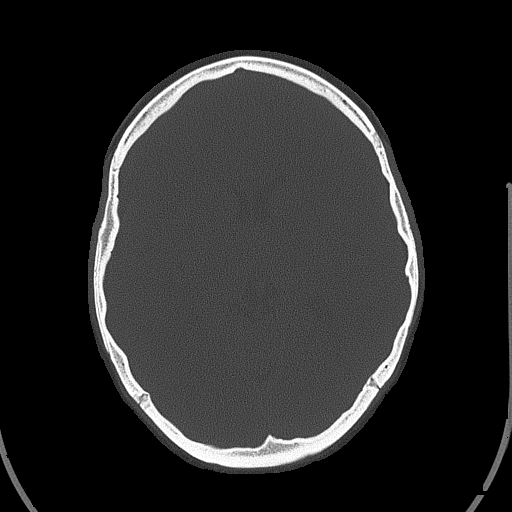
[im 38/66  bone]
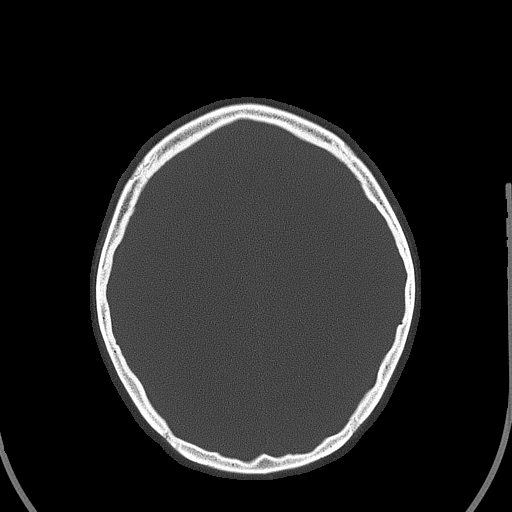
[im 45/66  bone]
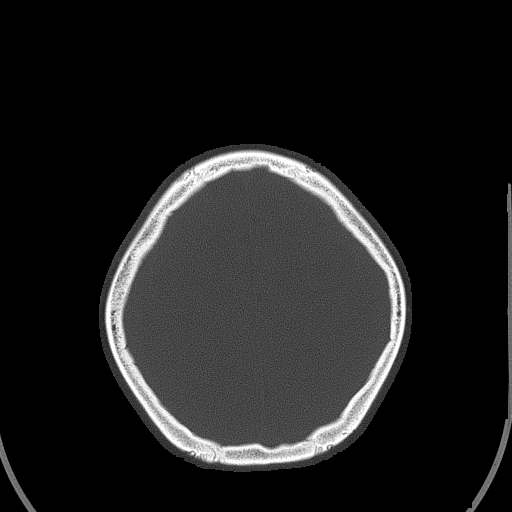
[im 52/66  bone]
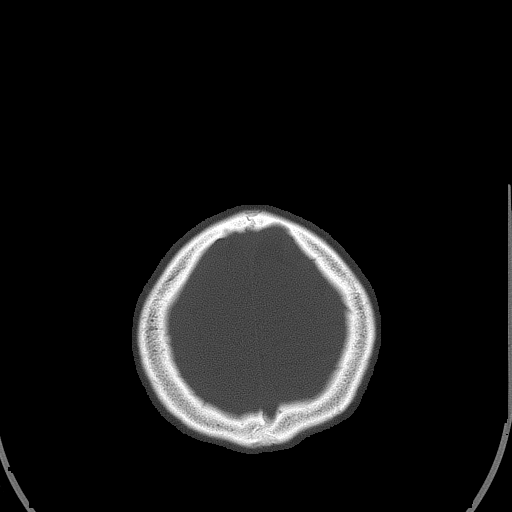
[im 59/66  bone]
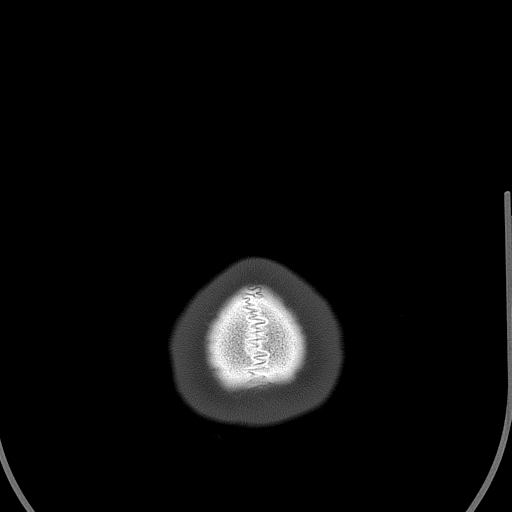

[16 of 30 positions shown; findings below may reference images not displayed]

FINDINGS: Mild mucosal thickening in the right maxillary sinus. Other
Visualized paranasal sinuses and mastoids are clear. Mild adenoid
hypertrophy. Visualized orbit soft tissues are within normal limits.
Visualized scalp soft tissues are within normal limits. Calvarium
intact. No acute osseous abnormality identified.

Cerebral volume is normal. No midline shift, ventriculomegaly, mass
effect, evidence of mass lesion, intracranial hemorrhage or evidence
of cortically based acute infarction. Gray-white matter
differentiation is within normal limits throughout the brain. No
suspicious intracranial vascular hyperdensity.
IMPRESSION: Normal non contrast appearance of the brain. No acute traumatic
injury identified.

## 2016-12-07 IMAGING — US US OB COMP LESS 14 WK
1 series · 13 of 28 positions shown · non-contrast
Comparison: April 03, 2016

CLINICAL DATA: Nausea and vomiting. Recent gestational sac
confirmation without fetus seen.

EXAM:
OBSTETRIC <14 WK US AND TRANSVAGINAL OB US
TECHNIQUE: Both transabdominal and transvaginal ultrasound examinations were
performed for complete evaluation of the gestation as well as the
maternal uterus, adnexal regions, and pelvic cul-de-sac.
Transvaginal technique was performed to assess early pregnancy.

[Series 1: us ob comp less 14 wk · 0.21mm/px · 13 of 55 slices shown]
[im 3/55]
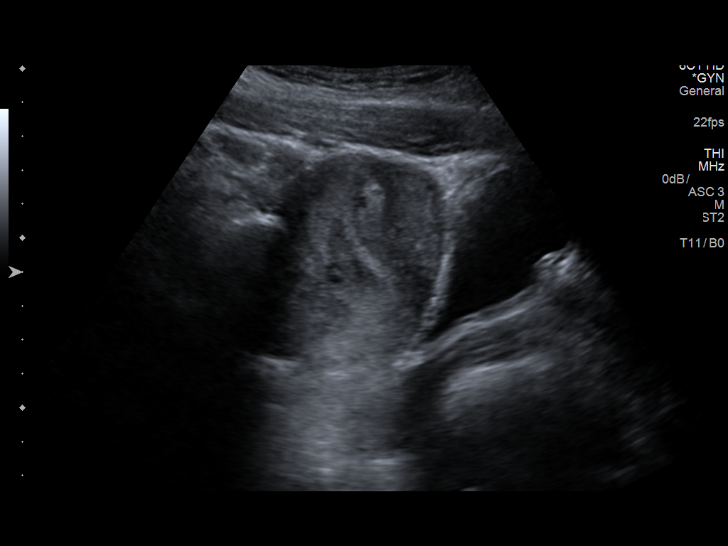
[im 7/55]
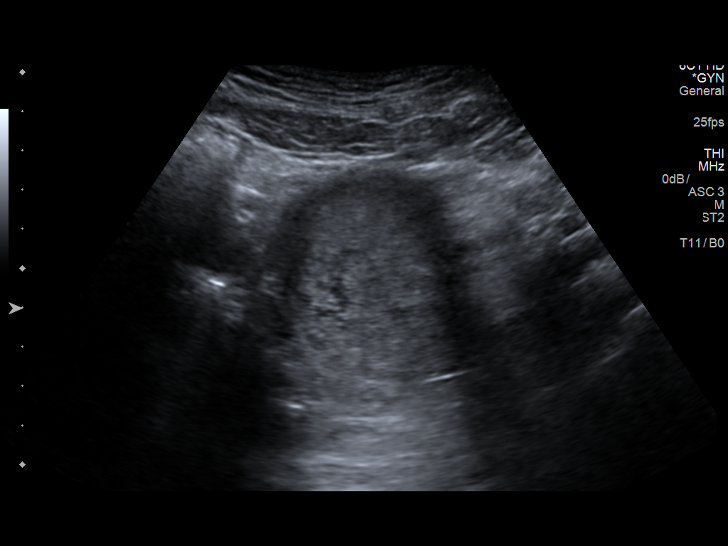
[im 11/55]
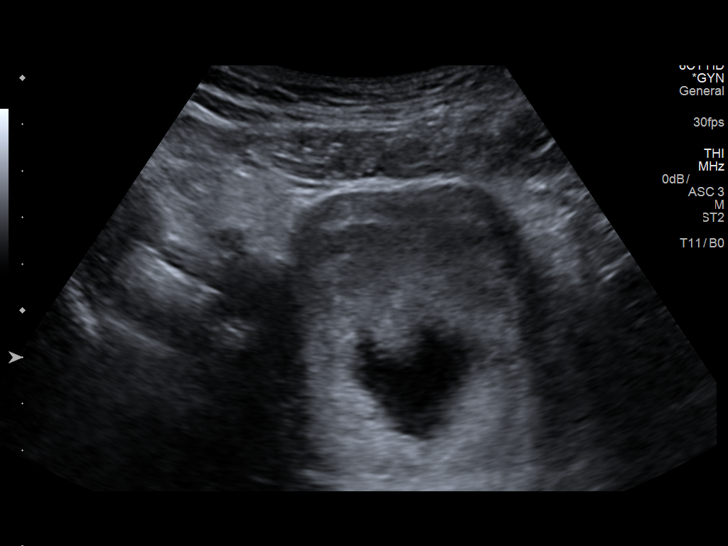
[im 15/55]
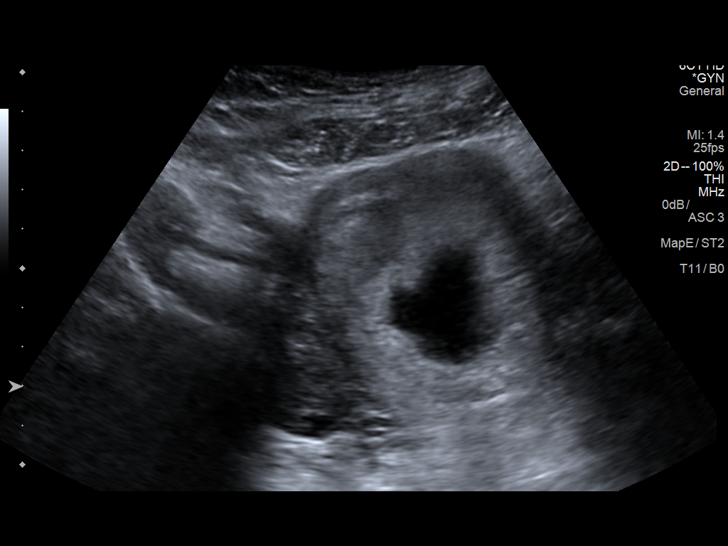
[im 19/55]
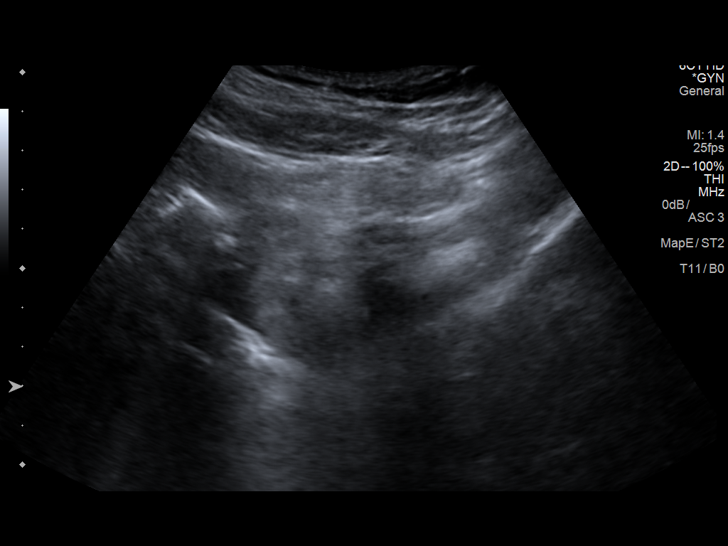
[im 23/55]
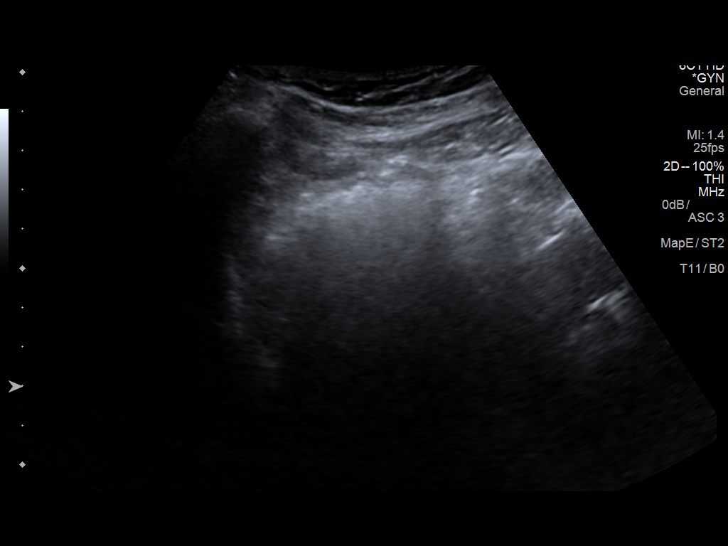
[im 29/55]
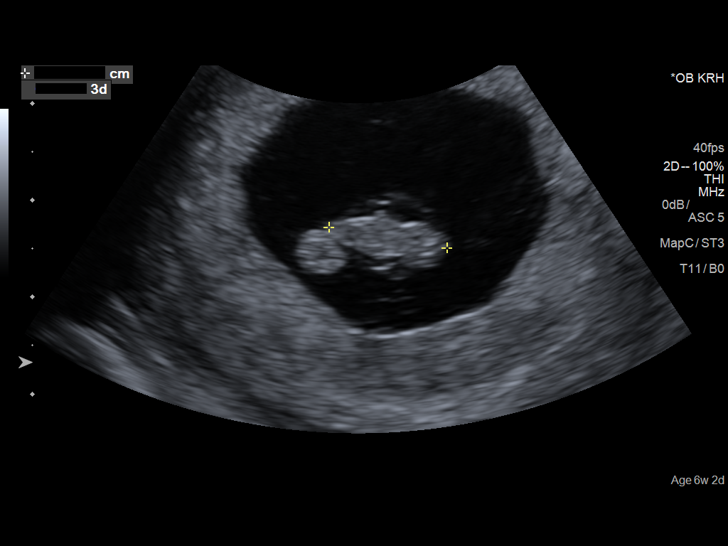
[im 33/55]
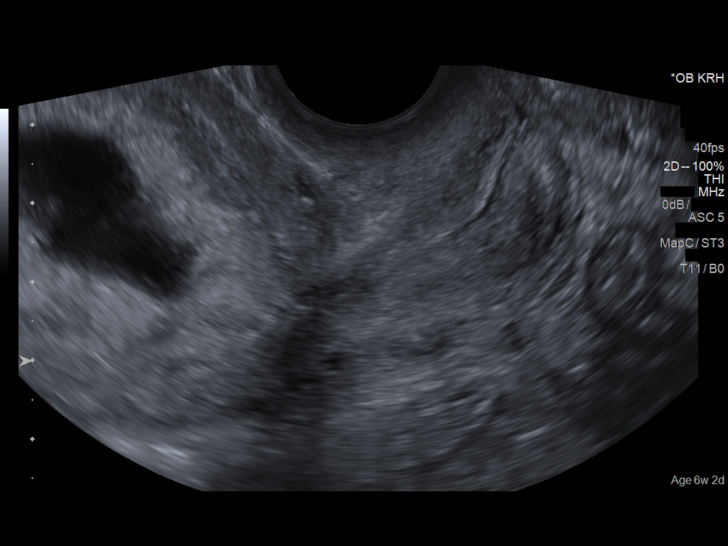
[im 37/55]
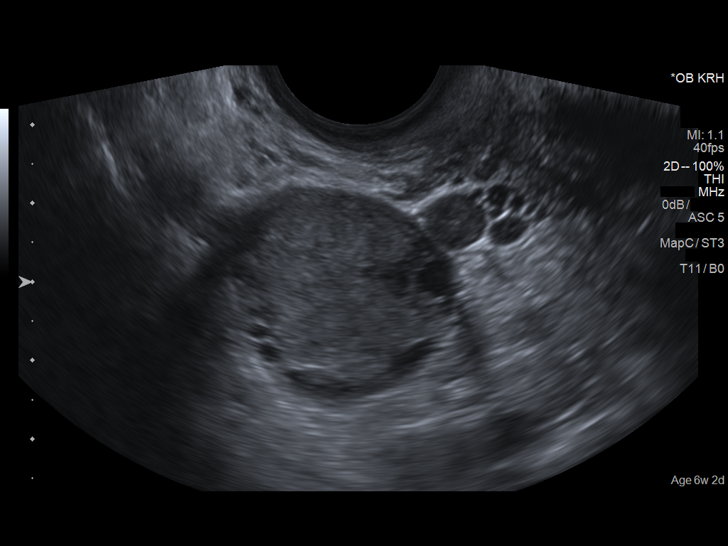
[im 41/55]
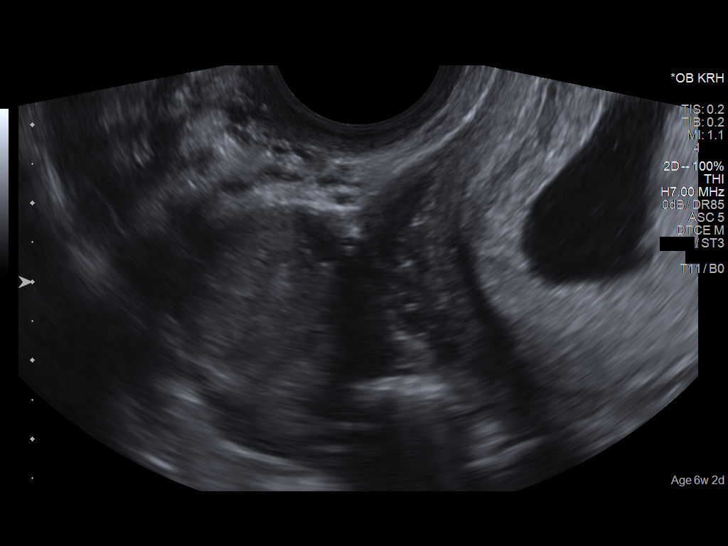
[im 45/55]
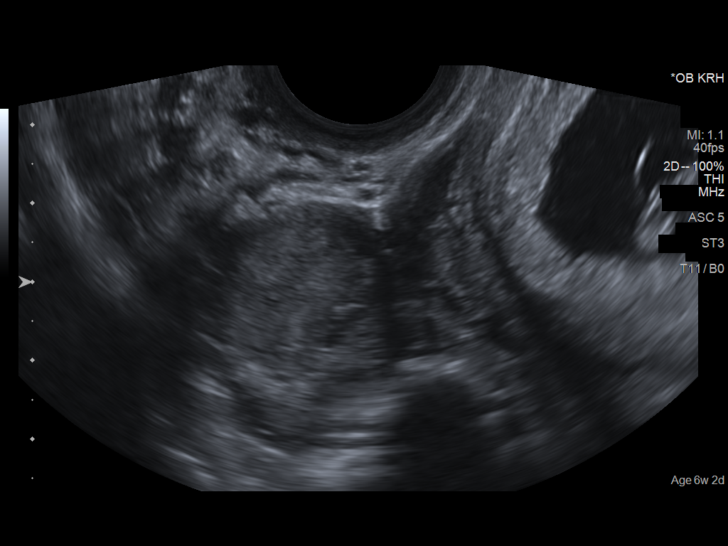
[im 49/55]
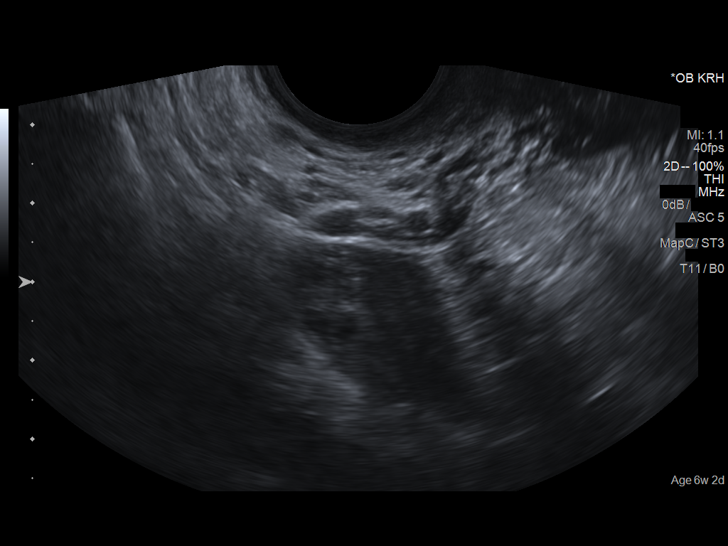
[im 53/55]
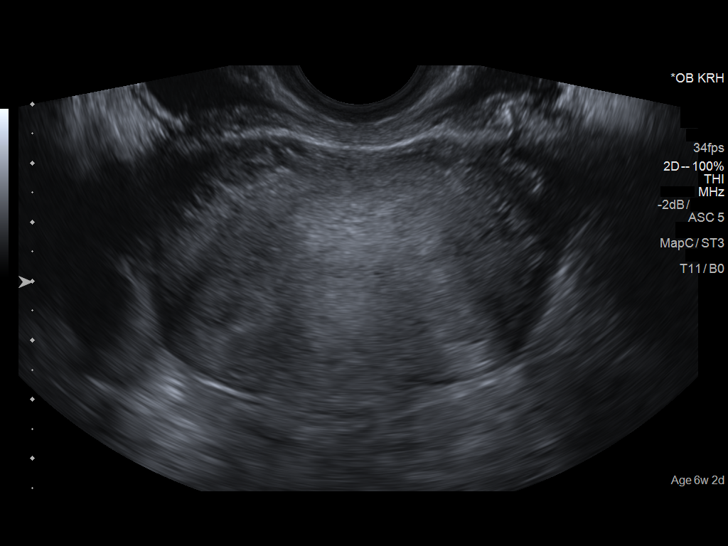

[13 of 28 positions shown; findings below may reference images not displayed]

FINDINGS: Intrauterine gestational sac: Visualized

Yolk sac:  Visualized

Embryo:  Visualized

Cardiac Activity: Visualized

Heart Rate: 145  bpm

CRL:  12  mm   7 w   3 d                  US EDC: November 29, 2016

Subchorionic hemorrhage:  None visualized.

Maternal uterus/adnexae: Cervical os is closed. Within the right
ovary, there is a focal isoechoic mass measuring 2.0 x 1.9 x 2.1 cm
which likely represents a hemorrhagic corpus luteum. This finding
was present on prior study. It overall appears marginally smaller.
No new pelvic mass. No free pelvic fluid.
IMPRESSION: Single live intrauterine gestation with estimated gestational age of
7+ weeks. Cervical os closed. No subchorionic hemorrhage. Probable
hemorrhagic corpus luteum on the right.
